# Patient Record
Sex: Female | Born: 1980
Health system: Southern US, Community
[De-identification: ages and names within clinical notes are randomized; demographics above are authoritative.]

## PROBLEM LIST (undated history)

## (undated) DIAGNOSIS — R112 Nausea with vomiting, unspecified: Secondary | ICD-10-CM

## (undated) DIAGNOSIS — Z3492 Encounter for supervision of normal pregnancy, unspecified, second trimester: Secondary | ICD-10-CM

## (undated) DIAGNOSIS — L309 Dermatitis, unspecified: Secondary | ICD-10-CM

## (undated) DIAGNOSIS — J302 Other seasonal allergic rhinitis: Secondary | ICD-10-CM

## (undated) DIAGNOSIS — F3281 Premenstrual dysphoric disorder: Secondary | ICD-10-CM

## (undated) DIAGNOSIS — F329 Major depressive disorder, single episode, unspecified: Secondary | ICD-10-CM

## (undated) DIAGNOSIS — J45909 Unspecified asthma, uncomplicated: Secondary | ICD-10-CM

## (undated) DIAGNOSIS — Z9889 Other specified postprocedural states: Secondary | ICD-10-CM

## (undated) DIAGNOSIS — F32A Depression, unspecified: Secondary | ICD-10-CM

## (undated) DIAGNOSIS — F419 Anxiety disorder, unspecified: Secondary | ICD-10-CM

## (undated) HISTORY — DX: Major depressive disorder, single episode, unspecified: F32.9

## (undated) HISTORY — PX: ABDOMINAL CERCLAGE: SHX5384

## (undated) HISTORY — DX: Other seasonal allergic rhinitis: J30.2

## (undated) HISTORY — DX: Anxiety disorder, unspecified: F41.9

## (undated) HISTORY — DX: Dermatitis, unspecified: L30.9

## (undated) HISTORY — DX: Encounter for supervision of normal pregnancy, unspecified, second trimester: Z34.92

## (undated) HISTORY — PX: WISDOM TOOTH EXTRACTION: SHX21

## (undated) HISTORY — DX: Depression, unspecified: F32.A

---

## 2005-02-17 ENCOUNTER — Other Ambulatory Visit: Admission: RE | Admit: 2005-02-17 | Discharge: 2005-02-17 | Payer: Self-pay | Admitting: Gynecology

## 2006-01-23 ENCOUNTER — Emergency Department (HOSPITAL_COMMUNITY): Admission: EM | Admit: 2006-01-23 | Discharge: 2006-01-23 | Payer: Self-pay | Admitting: *Deleted

## 2006-03-17 ENCOUNTER — Other Ambulatory Visit: Admission: RE | Admit: 2006-03-17 | Discharge: 2006-03-17 | Payer: Self-pay | Admitting: Gynecology

## 2006-11-04 ENCOUNTER — Other Ambulatory Visit: Admission: RE | Admit: 2006-11-04 | Discharge: 2006-11-04 | Payer: Self-pay | Admitting: Gynecology

## 2007-11-08 ENCOUNTER — Other Ambulatory Visit: Admission: RE | Admit: 2007-11-08 | Discharge: 2007-11-08 | Payer: Self-pay | Admitting: Gynecology

## 2008-12-20 ENCOUNTER — Other Ambulatory Visit: Admission: RE | Admit: 2008-12-20 | Discharge: 2008-12-20 | Payer: Self-pay | Admitting: Gynecology

## 2008-12-20 ENCOUNTER — Encounter: Payer: Self-pay | Admitting: Gynecology

## 2008-12-20 ENCOUNTER — Ambulatory Visit: Payer: Self-pay | Admitting: Gynecology

## 2008-12-25 ENCOUNTER — Ambulatory Visit: Payer: Self-pay | Admitting: Gynecology

## 2009-12-31 ENCOUNTER — Other Ambulatory Visit: Admission: RE | Admit: 2009-12-31 | Discharge: 2009-12-31 | Payer: Self-pay | Admitting: Gynecology

## 2009-12-31 ENCOUNTER — Ambulatory Visit: Payer: Self-pay | Admitting: Gynecology

## 2010-01-13 ENCOUNTER — Ambulatory Visit: Payer: Self-pay | Admitting: Gynecology

## 2011-01-06 ENCOUNTER — Encounter (INDEPENDENT_AMBULATORY_CARE_PROVIDER_SITE_OTHER): Payer: Self-pay | Admitting: Gynecology

## 2011-01-06 ENCOUNTER — Other Ambulatory Visit (HOSPITAL_COMMUNITY)
Admission: RE | Admit: 2011-01-06 | Discharge: 2011-01-06 | Disposition: A | Discharge status: Home / Self Care | Payer: Self-pay | Source: Ambulatory Visit | Attending: Gynecology | Admitting: Gynecology

## 2011-01-06 ENCOUNTER — Other Ambulatory Visit: Payer: Self-pay | Admitting: Gynecology

## 2011-01-06 DIAGNOSIS — Z124 Encounter for screening for malignant neoplasm of cervix: Secondary | ICD-10-CM | POA: Insufficient documentation

## 2011-01-06 DIAGNOSIS — Z01419 Encounter for gynecological examination (general) (routine) without abnormal findings: Secondary | ICD-10-CM

## 2011-01-14 ENCOUNTER — Other Ambulatory Visit: Payer: Self-pay

## 2011-01-15 ENCOUNTER — Other Ambulatory Visit: Payer: Self-pay

## 2011-01-15 DIAGNOSIS — E559 Vitamin D deficiency, unspecified: Secondary | ICD-10-CM

## 2011-02-18 ENCOUNTER — Ambulatory Visit (INDEPENDENT_AMBULATORY_CARE_PROVIDER_SITE_OTHER): Payer: Self-pay | Admitting: Gynecology

## 2011-02-18 ENCOUNTER — Other Ambulatory Visit: Payer: Self-pay

## 2011-02-18 DIAGNOSIS — N949 Unspecified condition associated with female genital organs and menstrual cycle: Secondary | ICD-10-CM

## 2011-02-18 DIAGNOSIS — N83209 Unspecified ovarian cyst, unspecified side: Secondary | ICD-10-CM

## 2011-02-18 DIAGNOSIS — R1031 Right lower quadrant pain: Secondary | ICD-10-CM

## 2011-02-18 DIAGNOSIS — D3910 Neoplasm of uncertain behavior of unspecified ovary: Secondary | ICD-10-CM

## 2011-06-23 ENCOUNTER — Encounter: Payer: Self-pay | Admitting: *Deleted

## 2011-06-23 DIAGNOSIS — F419 Anxiety disorder, unspecified: Secondary | ICD-10-CM | POA: Insufficient documentation

## 2011-06-23 DIAGNOSIS — F32A Depression, unspecified: Secondary | ICD-10-CM | POA: Insufficient documentation

## 2011-06-23 DIAGNOSIS — F329 Major depressive disorder, single episode, unspecified: Secondary | ICD-10-CM | POA: Insufficient documentation

## 2011-06-23 DIAGNOSIS — G43909 Migraine, unspecified, not intractable, without status migrainosus: Secondary | ICD-10-CM | POA: Insufficient documentation

## 2011-07-01 ENCOUNTER — Other Ambulatory Visit: Payer: Self-pay

## 2011-07-01 ENCOUNTER — Ambulatory Visit (INDEPENDENT_AMBULATORY_CARE_PROVIDER_SITE_OTHER): Payer: Self-pay | Admitting: Gynecology

## 2011-07-01 DIAGNOSIS — N83209 Unspecified ovarian cyst, unspecified side: Secondary | ICD-10-CM

## 2011-07-01 DIAGNOSIS — N949 Unspecified condition associated with female genital organs and menstrual cycle: Secondary | ICD-10-CM

## 2011-07-01 DIAGNOSIS — D390 Neoplasm of uncertain behavior of uterus: Secondary | ICD-10-CM

## 2011-07-01 DIAGNOSIS — N8300 Follicular cyst of ovary, unspecified side: Secondary | ICD-10-CM

## 2011-07-01 NOTE — Progress Notes (Signed)
And patient is a 30 year old gravida 0 who presented to the office today for followup she was seen in the office on April 20 as a result of a prior visit to the emergency room due to the acute nature of right lower quadrant pain. When she was seen in the office on that day she was found to have a right ovarian cyst which measured 37 x 38 x 29 mm with positive color flow left ovary was otherwise normal it appeared that this was a hemorrhagic cyst and she was given Motrin to take when necessary for her discomfort and was instructed to return back to the office in 3 months for followup ultrasound she did today.  Ultrasound: Uterus measuring 68 x 36 x 34 mm with endometrial stripe of 4.0 mm. Today is day 14 of her cycle. Previous cystic mass of the right ovary not seen only right and left small follicles noted. Patient is asymptomatic. Patient is otherwise scheduled to return back next year for her annual exam. She is to continue her Sprintec oral contraceptive pill.

## 2011-08-06 ENCOUNTER — Encounter: Payer: Self-pay | Admitting: Gynecology

## 2011-08-11 ENCOUNTER — Other Ambulatory Visit: Payer: Self-pay | Admitting: *Deleted

## 2011-08-11 MED ORDER — ALPRAZOLAM 0.5 MG PO TABS: 0.5000 mg | ORAL_TABLET | Freq: Every evening | ORAL | Status: AC | PRN

## 2011-08-11 NOTE — Telephone Encounter (Signed)
Pharmacy faxed over refill request

## 2011-08-11 NOTE — Telephone Encounter (Signed)
rx called in to pharmacy. Dr.G approved rx with 1 additional refill.

## 2012-01-10 ENCOUNTER — Other Ambulatory Visit: Payer: Self-pay | Admitting: *Deleted

## 2012-01-10 MED ORDER — NORGESTIMATE-ETH ESTRADIOL 0.25-35 MG-MCG PO TABS: 1.0000 | ORAL_TABLET | Freq: Every day | ORAL | Status: DC

## 2012-01-13 ENCOUNTER — Other Ambulatory Visit: Payer: Self-pay | Admitting: *Deleted

## 2012-01-13 ENCOUNTER — Encounter: Payer: Self-pay | Admitting: Gynecology

## 2012-01-13 ENCOUNTER — Ambulatory Visit (INDEPENDENT_AMBULATORY_CARE_PROVIDER_SITE_OTHER): Payer: Self-pay | Admitting: Gynecology

## 2012-01-13 VITALS — BP 122/80 | Ht 66.25 in | Wt 205.0 lb

## 2012-01-13 DIAGNOSIS — IMO0001 Reserved for inherently not codable concepts without codable children: Secondary | ICD-10-CM

## 2012-01-13 DIAGNOSIS — Z8041 Family history of malignant neoplasm of ovary: Secondary | ICD-10-CM

## 2012-01-13 DIAGNOSIS — Z01419 Encounter for gynecological examination (general) (routine) without abnormal findings: Secondary | ICD-10-CM

## 2012-01-13 DIAGNOSIS — F411 Generalized anxiety disorder: Secondary | ICD-10-CM

## 2012-01-13 DIAGNOSIS — F419 Anxiety disorder, unspecified: Secondary | ICD-10-CM

## 2012-01-13 DIAGNOSIS — Z8669 Personal history of other diseases of the nervous system and sense organs: Secondary | ICD-10-CM

## 2012-01-13 DIAGNOSIS — Z309 Encounter for contraceptive management, unspecified: Secondary | ICD-10-CM

## 2012-01-13 LAB — CBC WITH DIFFERENTIAL/PLATELET
Basophils Relative: 0 % (ref 0–1)
Eosinophils Absolute: 0.3 10*3/uL (ref 0.0–0.7)
Eosinophils Relative: 4 % (ref 0–5)
Lymphocytes Relative: 27 % (ref 12–46)
Lymphs Abs: 2.2 10*3/uL (ref 0.7–4.0)
MCV: 103.2 fL — ABNORMAL HIGH (ref 78.0–100.0)
Monocytes Absolute: 0.3 10*3/uL (ref 0.1–1.0)
Monocytes Relative: 4 % (ref 3–12)
Neutro Abs: 5.4 10*3/uL (ref 1.7–7.7)

## 2012-01-13 LAB — CHOLESTEROL, TOTAL: Cholesterol: 185 mg/dL (ref 0–200)

## 2012-01-13 MED ORDER — SUMATRIPTAN SUCCINATE 100 MG PO TABS: 100.0000 mg | ORAL_TABLET | ORAL | Status: DC | PRN

## 2012-01-13 MED ORDER — NORGESTIMATE-ETH ESTRADIOL 0.25-35 MG-MCG PO TABS: 1.0000 | ORAL_TABLET | Freq: Every day | ORAL | Status: DC

## 2012-01-13 MED ORDER — SUMATRIPTAN SUCCINATE 50 MG PO TABS: 50.0000 mg | ORAL_TABLET | ORAL | Status: DC | PRN

## 2012-01-13 MED ORDER — ALPRAZOLAM 0.5 MG PO TABS: 0.5000 mg | ORAL_TABLET | ORAL | Status: DC | PRN

## 2012-01-13 NOTE — Patient Instructions (Addendum)
Smoking Cessation This document explains the best ways for you to quit smoking and new treatments to help. It lists new medicines that can double or triple your chances of quitting and quitting for good. It also considers ways to avoid relapses and concerns you may have about quitting, including weight gain. NICOTINE: A POWERFUL ADDICTION If you have tried to quit smoking, you know how hard it can be. It is hard because nicotine is a very addictive drug. For some people, it can be as addictive as heroin or cocaine. Usually, people make 2 or 3 tries, or more, before finally being able to quit. Each time you try to quit, you can learn about what helps and what hurts. Quitting takes hard work and a lot of effort, but you can quit smoking. QUITTING SMOKING IS ONE OF THE MOST IMPORTANT THINGS YOU WILL EVER DO.  You will live longer, feel better, and live better.   The impact on your body of quitting smoking is felt almost immediately:   Within 20 minutes, blood pressure decreases. Pulse returns to its normal level.   After 8 hours, carbon monoxide levels in the blood return to normal. Oxygen level increases.   After 24 hours, chance of heart attack starts to decrease. Breath, hair, and body stop smelling like smoke.   After 48 hours, damaged nerve endings begin to recover. Sense of taste and smell improve.   After 72 hours, the body is virtually free of nicotine. Bronchial tubes relax and breathing becomes easier.   After 2 to 12 weeks, lungs can hold more air. Exercise becomes easier and circulation improves.   Quitting will reduce your risk of having a heart attack, stroke, cancer, or lung disease:   After 1 year, the risk of coronary heart disease is cut in half.   After 5 years, the risk of stroke falls to the same as a nonsmoker.   After 10 years, the risk of lung cancer is cut in half and the risk of other cancers decreases significantly.   After 15 years, the risk of coronary heart  disease drops, usually to the level of a nonsmoker.   If you are pregnant, quitting smoking will improve your chances of having a healthy baby.   The people you live with, especially your children, will be healthier.   You will have extra money to spend on things other than cigarettes.  FIVE KEYS TO QUITTING Studies have shown that these 5 steps will help you quit smoking and quit for good. You have the best chances of quitting if you use them together: 1. Get ready.  2. Get support and encouragement.  3. Learn new skills and behaviors.  4. Get medicine to reduce your nicotine addiction and use it correctly.  5. Be prepared for relapse or difficult situations. Be determined to continue trying to quit, even if you do not succeed at first.  1. GET READY  Set a quit date.   Change your environment.   Get rid of ALL cigarettes, ashtrays, matches, and lighters in your home, car, and place of work.   Do not let people smoke in your home.   Review your past attempts to quit. Think about what worked and what did not.   Once you quit, do not smoke. NOT EVEN A PUFF!  2. GET SUPPORT AND ENCOURAGEMENT Studies have shown that you have a better chance of being successful if you have help. You can get support in many ways.  Tell   your family, friends, and coworkers that you are going to quit and need their support. Ask them not to smoke around you.   Talk to your caregivers (doctor, dentist, nurse, pharmacist, psychologist, and/or smoking counselor).   Get individual, group, or telephone counseling and support. The more counseling you have, the better your chances are of quitting. Programs are available at local hospitals and health centers. Call your local health department for information about programs in your area.   Spiritual beliefs and practices may help some smokers quit.   Quit meters are small computer programs online or downloadable that keep track of quit statistics, such as amount  of "quit-time," cigarettes not smoked, and money saved.   Many smokers find one or more of the many self-help books available useful in helping them quit and stay off tobacco.  3. LEARN NEW SKILLS AND BEHAVIORS  Try to distract yourself from urges to smoke. Talk to someone, go for a walk, or occupy your time with a task.   When you first try to quit, change your routine. Take a different route to work. Drink tea instead of coffee. Eat breakfast in a different place.   Do something to reduce your stress. Take a hot bath, exercise, or read a book.   Plan something enjoyable to do every day. Reward yourself for not smoking.   Explore interactive web-based programs that specialize in helping you quit.  4. GET MEDICINE AND USE IT CORRECTLY Medicines can help you stop smoking and decrease the urge to smoke. Combining medicine with the above behavioral methods and support can quadruple your chances of successfully quitting smoking. The [place] Food and Drug Administration (FDA) has approved 7 medicines to help you quit smoking. These medicines fall into 3 categories.  Nicotine replacement therapy (delivers nicotine to your body without the negative effects and risks of smoking):   Nicotine gum: Available over-the-counter.   Nicotine lozenges: Available over-the-counter.   Nicotine inhaler: Available by prescription.   Nicotine nasal spray: Available by prescription.   Nicotine skin patches (transdermal): Available by prescription and over-the-counter.   Antidepressant medicine (helps people abstain from smoking, but how this works is unknown):   Bupropion sustained-release (SR) tablets: Available by prescription.   Nicotinic receptor partial agonist (simulates the effect of nicotine in your brain):   Varenicline tartrate tablets: Available by prescription.   Ask your caregiver for advice about which medicines to use and how to use them. Carefully read the information on the package.    Everyone who is trying to quit may benefit from using a medicine. If you are pregnant or trying to become pregnant, nursing an infant, you are under age 18, or you smoke fewer than 10 cigarettes per day, talk to your caregiver before taking any nicotine replacement medicines.   You should stop using a nicotine replacement product and call your caregiver if you experience nausea, dizziness, weakness, vomiting, fast or irregular heartbeat, mouth problems with the lozenge or gum, or redness or swelling of the skin around the patch that does not go away.   Do not use any other product containing nicotine while using a nicotine replacement product.   Talk to your caregiver before using these products if you have diabetes, heart disease, asthma, stomach ulcers, you had a recent heart attack, you have high blood pressure that is not controlled with medicine, a history of irregular heartbeat, or you have been prescribed medicine to help you quit smoking.  5. BE PREPARED FOR RELAPSE OR   DIFFICULT SITUATIONS  Most relapses occur within the first 3 months after quitting. Do not be discouraged if you start smoking again. Remember, most people try several times before they finally quit.   You may have symptoms of withdrawal because your body is used to nicotine. You may crave cigarettes, be irritable, feel very hungry, cough often, get headaches, or have difficulty concentrating.   The withdrawal symptoms are only temporary. They are strongest when you first quit, but they will go away within 10 to 14 days.  Here are some difficult situations to watch for:  Alcohol. Avoid drinking alcohol. Drinking lowers your chances of successfully quitting.   Caffeine. Try to reduce the amount of caffeine you consume. It also lowers your chances of successfully quitting.   Other smokers. Being around smoking can make you want to smoke. Avoid smokers.   Weight gain. Many smokers will gain weight when they quit, usually  less than 10 pounds. Eat a healthy diet and stay active. Do not let weight gain distract you from your main goal, quitting smoking. Some medicines that help you quit smoking may also help delay weight gain. You can always lose the weight gained after you quit.   Bad mood or depression. There are a lot of ways to improve your mood other than smoking.  If you are having problems with any of these situations, talk to your caregiver. SPECIAL SITUATIONS AND CONDITIONS Studies suggest that everyone can quit smoking. Your situation or condition can give you a special reason to quit.  Pregnant women/new mothers: By quitting, you protect your baby's health and your own.   Hospitalized patients: By quitting, you reduce health problems and help healing.   Heart attack patients: By quitting, you reduce your risk of a second heart attack.   Lung, head, and neck cancer patients: By quitting, you reduce your chance of a second cancer.   Parents of children and adolescents: By quitting, you protect your children from illnesses caused by secondhand smoke.  QUESTIONS TO THINK ABOUT Think about the following questions before you try to stop smoking. You may want to talk about your answers with your caregiver.  Why do you want to quit?   If you tried to quit in the past, what helped and what did not?   What will be the most difficult situations for you after you quit? How will you plan to handle them?   Who can help you through the tough times? Your family? Friends? Caregiver?   What pleasures do you get from smoking? What ways can you still get pleasure if you quit?  Here are some questions to ask your caregiver:  How can you help me to be successful at quitting?   What medicine do you think would be best for me and how should I take it?   What should I do if I need more help?   What is smoking withdrawal like? How can I get information on withdrawal?  Quitting takes hard work and a lot of effort,  but you can quit smoking. FOR MORE INFORMATION  Smokefree.gov (http://www.davis-sullivan.com/) provides free, accurate, evidence-based information and professional assistance to help support the immediate and long-term needs of people trying to quit smoking. Document Released: 10/19/2001 Document Revised: 10/14/2011 Document Reviewed: 08/11/2009 [facility] Patient Information 2012 [place] [address], [place].  Exercise to Lose Weight Exercise and a healthy diet may help you lose weight. Your doctor may suggest specific exercises. EXERCISE IDEAS AND TIPS  Choose low-cost things you enjoy  doing, such as walking, bicycling, or exercising to workout videos.   Take stairs instead of the elevator.   Walk during your lunch break.   Park your car further away from work or school.   Go to a gym or an exercise class.   Start with 5 to 10 minutes of exercise each day. Build up to 30 minutes of exercise 4 to 6 days a week.   Wear shoes with good support and comfortable clothes.   Stretch before and after working out.   Work out until you breathe harder and your heart beats faster.   Drink extra water when you exercise.   Do not do so much that you hurt yourself, feel dizzy, or get very short of breath.  Exercises that burn about 150 calories:  Running 1  miles in 15 minutes.   Playing volleyball for 45 to 60 minutes.   Washing and waxing a car for 45 to 60 minutes.   Playing touch football for 45 minutes.   Walking 1  miles in 35 minutes.   Pushing a stroller 1  miles in 30 minutes.   Playing basketball for 30 minutes.   Raking leaves for 30 minutes.   Bicycling 5 miles in 30 minutes.   Walking 2 miles in 30 minutes.   Dancing for 30 minutes.   Shoveling snow for 15 minutes.   Swimming laps for 20 minutes.   Walking up stairs for 15 minutes.   Bicycling 4 miles in 15 minutes.   Gardening for 30 to 45 minutes.   Jumping rope for 15 minutes.   Washing windows or floors for 45 to  60 minutes.  Document Released: 11/27/2010 Document Revised: 07/07/2011 Document Reviewed: 11/27/2010 [facility] Patient Information 2012 [place], [place].

## 2012-01-13 NOTE — Progress Notes (Signed)
Sherry Arias 1980-11-19 [id number]   History:    31 y.o.  for annual exam with no complaints. Patient is on Sprintec 32 year old contraceptive pills. Patient having normal menstrual cycles. Patient does her monthly self breast examination. Patient with family history of ovarian cancer (mother and aunt).  Past medical history,surgical history, family history and social history were all reviewed and documented in the EPIC chart.  Gynecologic History Patient's last menstrual period was 01/03/2012. Contraception: OCP (estrogen/progesterone) Last Pap: 2012. Results were: normal Last mammogram: Not indicated. Results were: Not indicated  Obstetric History OB History    Grav Para Term Preterm Abortions TAB SAB Ect Mult Living   0                ROS:  Was performed and pertinent positives and negatives are included in the history.  Exam: chaperone present  BP 122/80  Ht 5' 6.25" (1.683 m)  Wt 205 lb (92.987 kg)  BMI 32.84 kg/m2  LMP 01/03/2012  Body mass index is 32.84 kg/(m^2).  General appearance : Well developed well nourished female. No acute distress HEENT: Neck supple, trachea midline, no carotid bruits, no thyroidmegaly Lungs: Clear to auscultation, no rhonchi or wheezes, or rib retractions  Heart: Regular rate and rhythm, no murmurs or gallops Breast:Examined in sitting and supine position were symmetrical in appearance, no palpable masses or tenderness,  no skin retraction, no nipple inversion, no nipple discharge, no skin discoloration, no axillary or supraclavicular lymphadenopathy Abdomen: no palpable masses or tenderness, no rebound or guarding Extremities: no edema or skin discoloration or tenderness  Pelvic:  Bartholin, Urethra, Skene Glands: Within normal limits             Vagina: No gross lesions or discharge  Cervix: No gross lesions or discharge  Uterus  anteverted, normal size, shape and consistency, non-tender and mobile  Adnexa  Without masses or  tenderness  Anus and perineum  normal   Rectovaginal  normal sphincter tone without palpated masses or tenderness             Hemoccult not done     Assessment/Plan:  31 y.o. female for annual exam overweight. We discussed importance a proper diet and exercise. Patient on Sprintec 31 year old contraceptive pill. Patient requesting refill. We discussed such needs to stop smoking because she is a high risk for DVT and pulmonary embolism. Her husband recently stop smoking and she's going to start working on this. CBC, cholesterol, and urinalysis was done today. New Pap smear guidelines discussed, no Pap smear done today. Next year will do a screening ultrasound and CA 125 because of family history of ovarian cancer. Last ultrasound in the office August of 2012 normal. And tie smoking measures instruction sheet provided today.    Ok Edwards MD, 10:02 AM 01/13/2012

## 2012-01-14 LAB — URINALYSIS W MICROSCOPIC + REFLEX CULTURE
Casts: NONE SEEN
Crystals: NONE SEEN
Ketones, ur: NEGATIVE mg/dL
Leukocytes, UA: NEGATIVE
Nitrite: NEGATIVE
Specific Gravity, Urine: <1.005 — ABNORMAL LOW (ref 1.005–1.030)
Squamous Epithelial / LPF: NONE SEEN
Urobilinogen, UA: 0.2 mg/dL (ref 0.0–1.0)
pH: 7.5 (ref 5.0–8.0)

## 2012-01-14 NOTE — Telephone Encounter (Signed)
rx called in

## 2012-02-02 ENCOUNTER — Telehealth: Payer: Self-pay | Admitting: *Deleted

## 2012-02-02 NOTE — Telephone Encounter (Signed)
Pt informed of recent u/a results.

## 2012-07-18 ENCOUNTER — Other Ambulatory Visit: Payer: Self-pay | Admitting: *Deleted

## 2012-07-18 MED ORDER — ALPRAZOLAM 0.5 MG PO TABS: 0.5000 mg | ORAL_TABLET | ORAL | Status: DC | PRN

## 2013-01-17 ENCOUNTER — Other Ambulatory Visit: Payer: Self-pay | Admitting: Gynecology

## 2013-01-17 ENCOUNTER — Ambulatory Visit (INDEPENDENT_AMBULATORY_CARE_PROVIDER_SITE_OTHER): Payer: Self-pay | Admitting: Gynecology

## 2013-01-17 ENCOUNTER — Encounter: Payer: Self-pay | Admitting: Gynecology

## 2013-01-17 VITALS — BP 126/88 | Ht 65.5 in | Wt 195.0 lb

## 2013-01-17 DIAGNOSIS — R634 Abnormal weight loss: Secondary | ICD-10-CM | POA: Insufficient documentation

## 2013-01-17 DIAGNOSIS — R5383 Other fatigue: Secondary | ICD-10-CM | POA: Insufficient documentation

## 2013-01-17 DIAGNOSIS — L709 Acne, unspecified: Secondary | ICD-10-CM | POA: Insufficient documentation

## 2013-01-17 DIAGNOSIS — L708 Other acne: Secondary | ICD-10-CM

## 2013-01-17 DIAGNOSIS — Z01419 Encounter for gynecological examination (general) (routine) without abnormal findings: Secondary | ICD-10-CM

## 2013-01-17 DIAGNOSIS — G43909 Migraine, unspecified, not intractable, without status migrainosus: Secondary | ICD-10-CM

## 2013-01-17 DIAGNOSIS — R5381 Other malaise: Secondary | ICD-10-CM

## 2013-01-17 DIAGNOSIS — N920 Excessive and frequent menstruation with regular cycle: Secondary | ICD-10-CM | POA: Insufficient documentation

## 2013-01-17 LAB — CBC WITH DIFFERENTIAL/PLATELET
Eosinophils Relative: 2 % (ref 0–5)
MCV: 99.3 fL (ref 78.0–100.0)
Monocytes Absolute: 0.4 10*3/uL (ref 0.1–1.0)
Monocytes Relative: 5 % (ref 3–12)
Neutro Abs: 5.3 10*3/uL (ref 1.7–7.7)
Neutrophils Relative %: 62 % (ref 43–77)
Platelets: 300 10*3/uL (ref 150–400)
RDW: 12.5 % (ref 11.5–15.5)

## 2013-01-17 LAB — COMPREHENSIVE METABOLIC PANEL
ALT: 20 U/L (ref 0–35)
AST: 20 U/L (ref 0–37)
Alkaline Phosphatase: 35 U/L — ABNORMAL LOW (ref 39–117)
BUN: 9 mg/dL (ref 6–23)
CO2: 26 mEq/L (ref 19–32)
Calcium: 8.9 mg/dL (ref 8.4–10.5)
Chloride: 105 mEq/L (ref 96–112)
Creat: 0.8 mg/dL (ref 0.50–1.10)
Glucose, Bld: 83 mg/dL (ref 70–99)
Sodium: 138 mEq/L (ref 135–145)
Total Bilirubin: 0.3 mg/dL (ref 0.3–1.2)

## 2013-01-17 MED ORDER — ALPRAZOLAM 0.5 MG PO TABS: 0.5000 mg | ORAL_TABLET | ORAL | Status: DC | PRN

## 2013-01-17 MED ORDER — LEVONORGESTREL-ETHINYL ESTRAD 0.1-20 MG-MCG PO TABS: 1.0000 | ORAL_TABLET | Freq: Every day | ORAL | Status: DC

## 2013-01-17 MED ORDER — SUMATRIPTAN SUCCINATE 100 MG PO TABS: 100.0000 mg | ORAL_TABLET | ORAL | Status: DC | PRN

## 2013-01-17 NOTE — Patient Instructions (Addendum)
Smoking Cessation Quitting smoking is important to your health and has many advantages. However, it is not always easy to quit since nicotine is a very addictive drug. Often times, people try 3 times or more before being able to quit. This document explains the best ways for you to prepare to quit smoking. Quitting takes hard work and a lot of effort, but you can do it. ADVANTAGES OF QUITTING SMOKING  You will live longer, feel better, and live better.  Your body will feel the impact of quitting smoking almost immediately.  Within 20 minutes, blood pressure decreases. Your pulse returns to its normal level.  After 8 hours, carbon monoxide levels in the blood return to normal. Your oxygen level increases.  After 24 hours, the chance of having a heart attack starts to decrease. Your breath, hair, and body stop smelling like smoke.  After 48 hours, damaged nerve endings begin to recover. Your sense of taste and smell improve.  After 72 hours, the body is virtually free of nicotine. Your bronchial tubes relax and breathing becomes easier.  After 2 to 12 weeks, lungs can hold more air. Exercise becomes easier and circulation improves.  The risk of having a heart attack, stroke, cancer, or lung disease is greatly reduced.  After 1 year, the risk of coronary heart disease is cut in half.  After 5 years, the risk of stroke falls to the same as a nonsmoker.  After 10 years, the risk of lung cancer is cut in half and the risk of other cancers decreases significantly.  After 15 years, the risk of coronary heart disease drops, usually to the level of a nonsmoker.  If you are pregnant, quitting smoking will improve your chances of having a healthy baby.  The people you live with, especially any children, will be healthier.  You will have extra money to spend on things other than cigarettes. QUESTIONS TO THINK ABOUT BEFORE ATTEMPTING TO QUIT You may want to talk about your answers with your  caregiver.  Why do you want to quit?  If you tried to quit in the past, what helped and what did not?  What will be the most difficult situations for you after you quit? How will you plan to handle them?  Who can help you through the tough times? Your family? Friends? A caregiver?  What pleasures do you get from smoking? What ways can you still get pleasure if you quit? Here are some questions to ask your caregiver:  How can you help me to be successful at quitting?  What medicine do you think would be best for me and how should I take it?  What should I do if I need more help?  What is smoking withdrawal like? How can I get information on withdrawal? GET READY  Set a quit date.  Change your environment by getting rid of all cigarettes, ashtrays, matches, and lighters in your home, car, or work. Do not let people smoke in your home.  Review your past attempts to quit. Think about what worked and what did not. GET SUPPORT AND ENCOURAGEMENT You have a better chance of being successful if you have help. You can get support in many ways.  Tell your family, friends, and co-workers that you are going to quit and need their support. Ask them not to smoke around you.  Get individual, group, or telephone counseling and support. Programs are available at local hospitals and health centers. Call your local health department for   information about programs in your area.  Spiritual beliefs and practices may help some smokers quit.  Download a "quit meter" on your computer to keep track of quit statistics, such as how long you have gone without smoking, cigarettes not smoked, and money saved.  Get a self-help book about quitting smoking and staying off of tobacco. LEARN NEW SKILLS AND BEHAVIORS  Distract yourself from urges to smoke. Talk to someone, go for a walk, or occupy your time with a task.  Change your normal routine. Take a different route to work. Drink tea instead of coffee.  Eat breakfast in a different place.  Reduce your stress. Take a hot bath, exercise, or read a book.  Plan something enjoyable to do every day. Reward yourself for not smoking.  Explore interactive web-based programs that specialize in helping you quit. GET MEDICINE AND USE IT CORRECTLY Medicines can help you stop smoking and decrease the urge to smoke. Combining medicine with the above behavioral methods and support can greatly increase your chances of successfully quitting smoking.  Nicotine replacement therapy helps deliver nicotine to your body without the negative effects and risks of smoking. Nicotine replacement therapy includes nicotine gum, lozenges, inhalers, nasal sprays, and skin patches. Some may be available over-the-counter and others require a prescription.  Antidepressant medicine helps people abstain from smoking, but how this works is unknown. This medicine is available by prescription.  Nicotinic receptor partial agonist medicine simulates the effect of nicotine in your brain. This medicine is available by prescription. Ask your caregiver for advice about which medicines to use and how to use them based on your health history. Your caregiver will tell you what side effects to look out for if you choose to be on a medicine or therapy. Carefully read the information on the package. Do not use any other product containing nicotine while using a nicotine replacement product.  RELAPSE OR DIFFICULT SITUATIONS Most relapses occur within the first 3 months after quitting. Do not be discouraged if you start smoking again. Remember, most people try several times before finally quitting. You may have symptoms of withdrawal because your body is used to nicotine. You may crave cigarettes, be irritable, feel very hungry, cough often, get headaches, or have difficulty concentrating. The withdrawal symptoms are only temporary. They are strongest when you first quit, but they will go away within  10 14 days. To reduce the chances of relapse, try to:  Avoid drinking alcohol. Drinking lowers your chances of successfully quitting.  Reduce the amount of caffeine you consume. Once you quit smoking, the amount of caffeine in your body increases and can give you symptoms, such as a rapid heartbeat, sweating, and anxiety.  Avoid smokers because they can make you want to smoke.  Do not let weight gain distract you. Many smokers will gain weight when they quit, usually less than 10 pounds. Eat a healthy diet and stay active. You can always lose the weight gained after you quit.  Find ways to improve your mood other than smoking. FOR MORE INFORMATION  www.smokefree.gov  Document Released: 10/19/2001 Document Revised: 04/25/2012 Document Reviewed: 02/03/2012 [facility] Patient Information 2013 [place], [place].  Varenicline oral tablets What is this medicine? VARENICLINE (var EN i kleen) is used to help people quit smoking. It can reduce the symptoms caused by stopping smoking. It is used with a patient support program recommended by your physician. This medicine may be used for other purposes; ask your health care provider or pharmacist if you  have questions. What should I tell my health care provider before I take this medicine? They need to know if you have any of these conditions: -bipolar disorder, depression, schizophrenia or other mental illness -heart disease -kidney disease -peripheral vascular disease -stroke -suicidal thoughts, plans, or attempt; a previous suicide attempt by you or a family member -an unusual or allergic reaction to varenicline, other medicines, foods, dyes, or preservatives -pregnant or trying to get pregnant -breast-feeding How should I use this medicine? You should set a date to stop smoking and tell your doctor. Start this medicine one week before the quit date. You can also start taking this medicine before you choose a quit date, and then pick a quit date  that is between 8 and 35 days of treatment with this medicine. Stick to your plan; ask about support groups or other ways to help you remain a 'quitter'. Take this medicine by mouth after eating. Take with a full glass of water. Follow the directions on the prescription label. Take your doses at regular intervals. Do not take your medicine more often than directed. A special MedGuide will be given to you by the pharmacist with each prescription and refill. Be sure to read this information carefully each time. Talk to your pediatrician regarding the use of this medicine in children. This medicine is not approved for use in children. Overdosage: If you think you have taken too much of this medicine contact a poison control center or emergency room at once. NOTE: This medicine is only for you. Do not share this medicine with others. What if I miss a dose? If you miss a dose, take it as soon as you can. If it is almost time for your next dose, take only that dose. Do not take double or extra doses. What may interact with this medicine? -insulin -other stop smoking aids -theophylline -warfarin This list may not describe all possible interactions. Give your health care provider a list of all the medicines, herbs, non-prescription drugs, or dietary supplements you use. Also tell them if you smoke, drink alcohol, or use illegal drugs. Some items may interact with your medicine. What should I watch for while using this medicine? Visit your doctor or health care professional for regular check ups. Ask for ongoing advice and encouragement from your doctor or healthcare professional, friends, and family to help you quit. If you smoke while on this medication, quit again Your mouth may get dry. Chewing sugarless gum or sucking hard candy, and drinking plenty of water may help. Contact your doctor if the problem does not go away or is severe. You may get drowsy or dizzy. Do not drive, use machinery, or do  anything that needs mental alertness until you know how this medicine affects you. Do not stand or sit up quickly, especially if you are an older patient. This reduces the risk of dizzy or fainting spells. The use of this medicine may increase the chance of suicidal thoughts or actions. Pay special attention to how you are responding while on this medicine. Any worsening of mood, or thoughts of suicide or dying should be reported to your health care professional right away. What side effects may I notice from receiving this medicine? Side effects that you should report to your doctor or health care professional as soon as possible: -allergic reactions like skin rash, itching or hives, swelling of the face, lips, tongue, or throat -breathing problems -changes in vision -chest pain or chest tightness -confusion, trouble speaking  or understanding -fast, irregular heartbeat -feeling faint or lightheaded, falls -fever -pain in legs when walking -problems with balance, talking, walking -ringing in ears -sudden numbness or weakness of the face, arm or leg -suicidal thoughts or other mood changes -trouble passing urine or change in the amount of urine -unusual bleeding or bruising -unusually weak or tired Side effects that usually do not require medical attention (report to your doctor or health care professional if they continue or are bothersome): -constipation -headache -nausea, vomiting -strange dreams -stomach gas -trouble sleeping This list may not describe all possible side effects. Call your doctor for medical advice about side effects. You may report side effects to FDA at 1-800-FDA-1088. Where should I keep my medicine? Keep out of the reach of children. Store at room temperature between 15 and 30 degrees C (59 and 86 degrees F). Throw away any unused medicine after the expiration date. NOTE: This sheet is a summary. It may not cover all possible information. If you have questions  about this medicine, talk to your doctor, pharmacist, or health care provider.  2013, [employer]. (06/01/2010 3:12:38 PM)

## 2013-01-17 NOTE — Progress Notes (Addendum)
Sherry Arias 15-Apr-1981 [id number]   History:    32 y.o.  for annual gyn exam with several complaints today such as tiredness, loss of weight as well as acne. She is currently on Sprintec 28 day oral contraceptive pills and states her cycles are very heavy as well. She's had mood swings as well as occasional migraines. She takes Imitrex for her migraines. Review of her record indicated she was weighing 205 pounds and is down to 195 although she states she is eating better. Patient with no history of abnormal Pap smears in the past. She does have strong family history of diabetes. She stated that in 2013 she was treated for almost 2 weeks for upper respiratory tract infection. She also has complained of vasomotor symptoms.  Past medical history,surgical history, family history and social history were all reviewed and documented in the EPIC chart.  Gynecologic History Patient's last menstrual period was 12/31/2012. Contraception: OCP (estrogen/progesterone) Last Pap: 2012. Results were: normal Last mammogram: not indicated. Results were: not indicated Obstetric History OB History   Grav Para Term Preterm Abortions TAB SAB Ect Mult Living   0                ROS: A ROS was performed and pertinent positives and negatives are included in the history.  GENERAL: No fevers or chills. HEENT: No change in vision, no earache, sore throat or sinus congestion. NECK: No pain or stiffness. CARDIOVASCULAR: No chest pain or pressure. No palpitations. PULMONARY: No shortness of breath, cough or wheeze. GASTROINTESTINAL: No abdominal pain, nausea, vomiting or diarrhea, melena or bright red blood per rectum. GENITOURINARY: No urinary frequency, urgency, hesitancy or dysuria. MUSCULOSKELETAL: No joint or muscle pain, no back pain, no recent trauma. DERMATOLOGIC: No rash, no itching, no lesions. ENDOCRINE: No polyuria, polydipsia, no heat or cold intolerance. No recent change in weight. HEMATOLOGICAL: No anemia or  easy bruising or bleeding. NEUROLOGIC: No headache, seizures, numbness, tingling or weakness. PSYCHIATRIC: No depression, no loss of interest in normal activity or change in sleep pattern.    Tattoos and rings throughout different parts of her body  Exam: chaperone present  BP 126/88  Ht 5' 5.5" (1.664 m)  Wt 195 lb (88.451 kg)  BMI 31.94 kg/m2  LMP 12/31/2012  Body mass index is 31.94 kg/(m^2).  General appearance : Well developed well nourished female. No acute distress HEENT: Neck supple, trachea midline, no carotid bruits, no thyroidmegaly Lungs: Clear to auscultation, no rhonchi or wheezes, or rib retractions  Heart: Regular rate and rhythm, no murmurs or gallops Breast:Examined in sitting and supine position were symmetrical in appearance, no palpable masses or tenderness,  no skin retraction, no nipple inversion, no nipple discharge, no skin discoloration, no axillary or supraclavicular lymphadenopathy Abdomen: no palpable masses or tenderness, no rebound or guarding Extremities: no edema or skin discoloration or tenderness  Pelvic:  Bartholin, Urethra, Skene Glands: Within normal limits             Vagina: No gross lesions or discharge  Cervix: No gross lesions or discharge  Uterus  anteverted, normal size, shape and consistency, non-tender and mobile  Adnexa  Without masses or tenderness  Anus and perineum  normal   Rectovaginal  normal sphincter tone without palpated masses or tenderness             Hemoccult not indicated     Assessment/Plan:  32 y.o. female for annual exam Patient with the above mentioned symptoms we'll order the following  test: Hepatitis panel, hemoglobin A1c, comprehensive metabolic panel, CBC, screening cholesterol, TSH, vitamin B12 and urinalysis. No Pap smear done today the new screening guidelines discussed. She will be switched to oral contraceptive pill with less progesterone and and a genetic activity. She will be started on Aviane 28 day oral  contraceptive pill. Patient was counseled of the possibility of being at risk for deep venous thrombosis and pulmonary embolism especially if she continues to smoke. Patient states that she's in the process of cutting this smoking habit. She was reminded to do her monthly self breast examination. If all the above labs are normal which she continues with the above symptoms I have recommended she followup with internal medicine specialists.    Ok Edwards MD, 2:18 PM 01/17/2013

## 2013-01-18 LAB — URINALYSIS W MICROSCOPIC + REFLEX CULTURE
Crystals: NONE SEEN
Glucose, UA: NEGATIVE mg/dL
Hgb urine dipstick: NEGATIVE
Nitrite: NEGATIVE
Protein, ur: NEGATIVE mg/dL
Urobilinogen, UA: 0.2 mg/dL (ref 0.0–1.0)
pH: 7.5 (ref 5.0–8.0)

## 2013-01-18 LAB — HEPATITIS PANEL, ACUTE
HCV Ab: NEGATIVE
Hep A IgM: NEGATIVE
Hepatitis B Surface Ag: NEGATIVE

## 2013-01-18 LAB — TSH: TSH: 1.227 u[IU]/mL (ref 0.350–4.500)

## 2013-01-24 ENCOUNTER — Telehealth: Payer: Self-pay | Admitting: *Deleted

## 2013-01-24 NOTE — Telephone Encounter (Signed)
Pt informed of recent lab results on 01/17/13.

## 2013-10-17 ENCOUNTER — Other Ambulatory Visit: Payer: Self-pay | Admitting: Obstetrics & Gynecology

## 2013-10-17 ENCOUNTER — Encounter: Payer: Self-pay | Admitting: Obstetrics and Gynecology

## 2013-10-17 ENCOUNTER — Ambulatory Visit (INDEPENDENT_AMBULATORY_CARE_PROVIDER_SITE_OTHER): Payer: Self-pay

## 2013-10-17 ENCOUNTER — Encounter: Payer: Self-pay | Admitting: Obstetrics & Gynecology

## 2013-10-17 ENCOUNTER — Ambulatory Visit (INDEPENDENT_AMBULATORY_CARE_PROVIDER_SITE_OTHER): Payer: Self-pay | Admitting: Obstetrics and Gynecology

## 2013-10-17 ENCOUNTER — Encounter (INDEPENDENT_AMBULATORY_CARE_PROVIDER_SITE_OTHER): Payer: Self-pay

## 2013-10-17 VITALS — BP 132/66 | Ht 66.0 in | Wt 210.4 lb

## 2013-10-17 DIAGNOSIS — O3680X0 Pregnancy with inconclusive fetal viability, not applicable or unspecified: Secondary | ICD-10-CM

## 2013-10-17 DIAGNOSIS — O26849 Uterine size-date discrepancy, unspecified trimester: Secondary | ICD-10-CM

## 2013-10-17 DIAGNOSIS — Z3201 Encounter for pregnancy test, result positive: Secondary | ICD-10-CM

## 2013-10-17 LAB — POCT URINE PREGNANCY: Preg Test, Ur: POSITIVE

## 2013-10-17 NOTE — Progress Notes (Signed)
U/S-transabdominal u/s performed, single IUP with +FCa noted, FHR-172 bpm, CRL c/w 11+0wks EDD 05/08/2014, cx long and closed, bilateral adnexa WNL, posterior Gr 0 placenta

## 2013-10-17 NOTE — Progress Notes (Signed)
Patient ID: Sherry Arias, female   DOB: Apr 04, 1981, 32 y.o.   MRN: [id number] Pt here today for a urine pregnancy test, resulted positive.  Pt informed to return to office in 1 - 2 weeks.

## 2013-10-24 ENCOUNTER — Encounter: Payer: Self-pay | Admitting: Obstetrics and Gynecology

## 2013-10-24 ENCOUNTER — Ambulatory Visit (INDEPENDENT_AMBULATORY_CARE_PROVIDER_SITE_OTHER): Payer: Self-pay | Admitting: Obstetrics and Gynecology

## 2013-10-24 VITALS — BP 120/76 | Ht 66.0 in | Wt 207.0 lb

## 2013-10-24 DIAGNOSIS — Z331 Pregnant state, incidental: Secondary | ICD-10-CM

## 2013-10-24 DIAGNOSIS — O9989 Other specified diseases and conditions complicating pregnancy, childbirth and the puerperium: Secondary | ICD-10-CM

## 2013-10-24 DIAGNOSIS — Z1389 Encounter for screening for other disorder: Secondary | ICD-10-CM

## 2013-10-24 LAB — POCT URINALYSIS DIPSTICK
Blood, UA: NEGATIVE
Ketones, UA: NEGATIVE
Protein, UA: NEGATIVE
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
## 2013-11-08 NOTE — L&D Delivery Note (Signed)
Delivery Note  At 3:08 pm, a previable female infant at [redacted]w[redacted]d was delivered in complete breech presentation. Cord was clamped and cut, he was handed to his mother (patient), father and mother's friend who are all grieving appropriately.  Delivery occurred spontaneously when patient was trying to sit up for an epidural placement, placement never occurred. Apgar scores were 2/2/2 (one for HR, one for respiratory effort) at 11/12/08 minutes.  Misoprostol 600 mcg was placed per rectum, and the placenta delivered spopntaneously at 3:37pm and was noted to be intact on examination.  EBL was 200 ml.  No complications during delivery.  Infant lived for 1 hour and 22 minutes, he was held by his parents for that entire period.  Appropriate support was given to the patient and her family members. Chaplain services were called and helped with giving support.  Patient was discharged to home after three hours of observation, she was stable and had minimal bleeding.  Verita Schneiders, MD, [facility] Attending [place], [place]

## 2013-11-09 ENCOUNTER — Encounter: Payer: Self-pay | Admitting: Adult Health

## 2013-11-09 ENCOUNTER — Ambulatory Visit (INDEPENDENT_AMBULATORY_CARE_PROVIDER_SITE_OTHER): Payer: BC Managed Care – PPO | Admitting: Adult Health

## 2013-11-09 ENCOUNTER — Other Ambulatory Visit: Payer: Self-pay | Admitting: Adult Health

## 2013-11-09 VITALS — BP 118/70 | Wt 218.0 lb

## 2013-11-09 DIAGNOSIS — Z1389 Encounter for screening for other disorder: Secondary | ICD-10-CM

## 2013-11-09 DIAGNOSIS — Z3492 Encounter for supervision of normal pregnancy, unspecified, second trimester: Secondary | ICD-10-CM

## 2013-11-09 DIAGNOSIS — Z331 Pregnant state, incidental: Secondary | ICD-10-CM

## 2013-11-09 DIAGNOSIS — O210 Mild hyperemesis gravidarum: Secondary | ICD-10-CM

## 2013-11-09 DIAGNOSIS — Z3400 Encounter for supervision of normal first pregnancy, unspecified trimester: Secondary | ICD-10-CM

## 2013-11-09 HISTORY — DX: Encounter for supervision of normal pregnancy, unspecified, second trimester: Z34.92

## 2013-11-09 LAB — HEPATITIS B SURFACE ANTIGEN: HEP B S AG: NEGATIVE

## 2013-11-09 LAB — POCT URINALYSIS DIPSTICK
Blood, UA: NEGATIVE
Glucose, UA: NEGATIVE
Ketones, UA: NEGATIVE
LEUKOCYTES UA: NEGATIVE
NITRITE UA: NEGATIVE
PROTEIN UA: NEGATIVE

## 2013-11-09 LAB — CBC
HEMATOCRIT: 38.9 % (ref 36.0–46.0)
Hemoglobin: 13.5 g/dL (ref 12.0–15.0)
MCH: 35.1 pg — ABNORMAL HIGH (ref 26.0–34.0)
MCHC: 34.7 g/dL (ref 30.0–36.0)
MCV: 101 fL — ABNORMAL HIGH (ref 78.0–100.0)
PLATELETS: 303 10*3/uL (ref 150–400)
RBC: 3.85 MIL/uL — ABNORMAL LOW (ref 3.87–5.11)
RDW: 13.3 % (ref 11.5–15.5)
WBC: 10 10*3/uL (ref 4.0–10.5)

## 2013-11-09 LAB — RPR

## 2013-11-09 LAB — HIV ANTIBODY (ROUTINE TESTING W REFLEX): HIV: NONREACTIVE

## 2013-11-09 LAB — TSH: TSH: 1.43 u[IU]/mL (ref 0.350–4.500)

## 2013-11-09 NOTE — Progress Notes (Signed)
  Subjective:    PAYTYN MESTA is a 33 y.o. G16P0 Caucasian female at [redacted]w[redacted]d by [place] being seen today for her first obstetrical visit.  Her obstetrical history is significant for allergies and migraine and depression.  Pregnancy history fully reviewed.   Patient reports nausea, headaches. Denies vb, cramping, uti s/s, abnormal/malodorous vag d/c, or vulvovaginal itching/irritation.  Filed Vitals:   11/09/13 0905  BP: 118/70  Weight: 218 lb (98.884 kg)    HISTORY: OB History  Gravida Para Term Preterm AB SAB TAB Ectopic Multiple Living  1             # Outcome Date GA Lbr Len/2nd Weight Sex Delivery Anes PTL Lv  1 CUR              Past Medical History  Diagnosis Date  . Anxiety   . Depression   . Migraine   . Eczema   . Seasonal allergies   . Supervision of normal pregnancy in second trimester 11/09/2013   Past Surgical History  Procedure Laterality Date  . Wisdom tooth extraction     Family History  Problem Relation Age of Onset  . Ovarian cancer Mother   . Cervical cancer Mother   . Mental illness Mother   . Heart disease Father   . Hypertension Father   . Diabetes Father   . Heart disease Paternal Grandfather      Exam    Pelvic Exam:    Perineum: deferred   Vulva: deferred   Vagina:  deferred   Uterus Normal size/shape/contour for GA     Cervix: normal   Adnexa: Not palpable   Urinary: deferred    System:     Skin: normal coloration and turgor, no rashes Has tattoos and piercing's    Neurologic: oriented, normal mood   Extremities: normal strength, tone, and muscle mass   HEENT PERRLA, thyroid normal size   Mouth/Teeth mucous membranes moist   Cardiovascular: regular rate and rhythm   Respiratory:  appears well, vitals normal, no respiratory distress, acyanotic, normal RR   Abdomen: soft, non-tender   Request pap from Dr Toney Rakes FHR: 161   Assessment:    Pregnancy: G1P0 Patient Active Problem List   Diagnosis Date Noted  . Supervision  of normal pregnancy in second trimester 11/09/2013  . Tiredness 01/17/2013  . Loss of weight 01/17/2013  . Acne 01/17/2013  . Menorrhagia 01/17/2013  . Anxiety   . Depression   . Migraine       [redacted]w[redacted]d G1P0 New OB visit    Plan:     Initial labs drawn Continue prenatal vitamins Problem list reviewed and updated Reviewed n/v relief measures and warning s/s to report Reviewed recommended weight gain based on pre-gravid BMI Encouraged well-balanced diet Genetic Screening discussed Quad Screen: requested Cystic fibrosis screening discussed declined Ultrasound discussed; fetal survey: requested Follow up in 4 weeks for OB visit and see KIM, discuss flu shot again has been sick so did not give today Will try diclegis for nausea  GRIFFIN,Kyomi 11/09/2013 9:37 AM

## 2013-11-09 NOTE — Patient Instructions (Signed)
Second Trimester of Pregnancy The second trimester is from week 13 through week 28, months 4 through 6. The second trimester is often a time when you feel your best. Your body has also adjusted to being pregnant, and you begin to feel better physically. Usually, morning sickness has lessened or quit completely, you may have more energy, and you may have an increase in appetite. The second trimester is also a time when the fetus is growing rapidly. At the end of the sixth month, the fetus is about 9 inches long and weighs about 1 pounds. You will likely begin to feel the baby move (quickening) between 18 and 20 weeks of the pregnancy. BODY CHANGES Your body goes through many changes during pregnancy. The changes vary from woman to woman.   Your weight will continue to increase. You will notice your lower abdomen bulging out.  You may begin to get stretch marks on your hips, abdomen, and breasts.  You may develop headaches that can be relieved by medicines approved by your caregiver.  You may urinate more often because the fetus is pressing on your bladder.  You may develop or continue to have heartburn as a result of your pregnancy.  You may develop constipation because certain hormones are causing the muscles that push waste through your intestines to slow down.  You may develop hemorrhoids or swollen, bulging veins (varicose veins).  You may have back pain because of the weight gain and pregnancy hormones relaxing your joints between the bones in your pelvis and as a result of a shift in weight and the muscles that support your balance.  Your breasts will continue to grow and be tender.  Your gums may bleed and may be sensitive to brushing and flossing.  Dark spots or blotches (chloasma, mask of pregnancy) may develop on your face. This will likely fade after the baby is born.  A dark line from your belly button to the pubic area (linea nigra) may appear. This will likely fade after the  baby is born. WHAT TO EXPECT AT YOUR PRENATAL VISITS During a routine prenatal visit:  You will be weighed to make sure you and the fetus are growing normally.  Your blood pressure will be taken.  Your abdomen will be measured to track your baby's growth.  The fetal heartbeat will be listened to.  Any test results from the previous visit will be discussed. Your caregiver may ask you:  How you are feeling.  If you are feeling the baby move.  If you have had any abnormal symptoms, such as leaking fluid, bleeding, severe headaches, or abdominal cramping.  If you have any questions. Other tests that may be performed during your second trimester include:  Blood tests that check for:  Low iron levels (anemia).  Gestational diabetes (between 24 and 28 weeks).  Rh antibodies.  Urine tests to check for infections, diabetes, or protein in the urine.  An ultrasound to confirm the proper growth and development of the baby.  An amniocentesis to check for possible genetic problems.  Fetal screens for spina bifida and Down syndrome. HOME CARE INSTRUCTIONS   Avoid all smoking, herbs, alcohol, and unprescribed drugs. These chemicals affect the formation and growth of the baby.  Follow your caregiver's instructions regarding medicine use. There are medicines that are either safe or unsafe to take during pregnancy.  Exercise only as directed by your caregiver. Experiencing uterine cramps is a good sign to stop exercising.  Continue to eat regular,   healthy meals.  Wear a good support bra for breast tenderness.  Do not use hot tubs, steam rooms, or saunas.  Wear your seat belt at all times when driving.  Avoid raw meat, uncooked cheese, cat litter boxes, and soil used by cats. These carry germs that can cause birth defects in the baby.  Take your prenatal vitamins.  Try taking a stool softener (if your caregiver approves) if you develop constipation. Eat more high-fiber foods,  such as fresh vegetables or fruit and whole grains. Drink plenty of fluids to keep your urine clear or pale yellow.  Take warm sitz baths to soothe any pain or discomfort caused by hemorrhoids. Use hemorrhoid cream if your caregiver approves.  If you develop varicose veins, wear support hose. Elevate your feet for 15 minutes, 3 4 times a day. Limit salt in your diet.  Avoid heavy lifting, wear low heel shoes, and practice good posture.  Rest with your legs elevated if you have leg cramps or low back pain.  Visit your dentist if you have not gone yet during your pregnancy. Use a soft toothbrush to brush your teeth and be gentle when you floss.  A sexual relationship may be continued unless your caregiver directs you otherwise.  Continue to go to all your prenatal visits as directed by your caregiver. SEEK MEDICAL CARE IF:   You have dizziness.  You have mild pelvic cramps, pelvic pressure, or nagging pain in the abdominal area.  You have persistent nausea, vomiting, or diarrhea.  You have a bad smelling vaginal discharge.  You have pain with urination. SEEK IMMEDIATE MEDICAL CARE IF:   You have a fever.  You are leaking fluid from your vagina.  You have spotting or bleeding from your vagina.  You have severe abdominal cramping or pain.  You have rapid weight gain or loss.  You have shortness of breath with chest pain.  You notice sudden or extreme swelling of your face, hands, ankles, feet, or legs.  You have not felt your baby move in over an hour.  You have severe headaches that do not go away with medicine.  You have vision changes. Document Released: 10/19/2001 Document Revised: 06/27/2013 Document Reviewed: 12/26/2012 [facility] Patient Information 2014 [place]. Follow up in 4 weeks for OB visit with KIM

## 2013-11-10 LAB — VARICELLA ZOSTER ANTIBODY, IGG: Varicella IgG: 782.5 Index — ABNORMAL HIGH (ref <135.00)

## 2013-11-10 LAB — URINALYSIS, ROUTINE W REFLEX MICROSCOPIC
BILIRUBIN URINE: NEGATIVE
GLUCOSE, UA: NEGATIVE mg/dL
HGB URINE DIPSTICK: NEGATIVE
Ketones, ur: NEGATIVE mg/dL
LEUKOCYTES UA: NEGATIVE
Nitrite: NEGATIVE
Protein, ur: NEGATIVE mg/dL
Specific Gravity, Urine: 1.018 (ref 1.005–1.030)
Urobilinogen, UA: 0.2 mg/dL (ref 0.0–1.0)
pH: 8 (ref 5.0–8.0)

## 2013-11-10 LAB — OXYCODONE SCREEN, UA, RFLX CONFIRM: Oxycodone Screen, Ur: NEGATIVE ng/mL

## 2013-11-10 LAB — DRUG SCREEN, URINE, NO CONFIRMATION
AMPHETAMINE SCRN UR: NEGATIVE
BARBITURATE QUANT UR: NEGATIVE
Benzodiazepines.: NEGATIVE
COCAINE METABOLITES: NEGATIVE
Creatinine,U: 72.6 mg/dL
MARIJUANA METABOLITE: NEGATIVE
Methadone: NEGATIVE
Opiate Screen, Urine: NEGATIVE
Phencyclidine (PCP): NEGATIVE
Propoxyphene: NEGATIVE

## 2013-11-10 LAB — ABO AND RH: Rh Type: POSITIVE

## 2013-11-10 LAB — RUBELLA SCREEN: RUBELLA: 1 {index} — AB (ref <0.90)

## 2013-11-10 LAB — ANTIBODY SCREEN: Antibody Screen: NEGATIVE

## 2013-11-11 LAB — URINE CULTURE
Colony Count: NO GROWTH
Organism ID, Bacteria: NO GROWTH

## 2013-11-11 LAB — GC/CHLAMYDIA PROBE AMP
CT PROBE, AMP APTIMA: NEGATIVE
GC PROBE AMP APTIMA: NEGATIVE

## 2013-11-12 LAB — AFP, QUAD SCREEN
AFP: 17.6 IU/mL
Age Alone: 1:428 {titer}
Curr Gest Age: 14.2 wks.days
Down Syndrome Scr Risk Est: 1:784 {titer}
HCG, Total: 29622 m[IU]/mL
INH: 197.5 pg/mL
INTERPRETATION-AFP: NEGATIVE
MOM FOR AFP: 0.94
MOM FOR HCG: 1.02
MoM for INH: 1.32
OPEN SPINA BIFIDA: NEGATIVE
TRI 18 SCR RISK EST: NEGATIVE
Trisomy 18 (Edward) Syndrome Interp.: 1:530 {titer}
UE3 MOM: 0.43
UE3 VALUE: 0.1 ng/mL

## 2013-11-15 ENCOUNTER — Telehealth: Payer: Self-pay | Admitting: Obstetrics and Gynecology

## 2013-11-15 MED ORDER — AZITHROMYCIN 250 MG PO TABS: ORAL_TABLET | ORAL | Status: DC

## 2013-11-15 NOTE — Telephone Encounter (Signed)
Pt c/o red and sore throat, runny nose, no fever, body aches, no improvement with gargling with salt water, lemon tea etc.  Pt states concerned she might have strep throat.

## 2013-11-15 NOTE — Telephone Encounter (Signed)
Has red sore throat and is hoarse, no fever or body aches ,will rx z pack ,use warm salt water gargles and hot fluids, call if fever or body aches

## 2013-12-03 ENCOUNTER — Other Ambulatory Visit (HOSPITAL_COMMUNITY)
Admission: RE | Admit: 2013-12-03 | Discharge: 2013-12-03 | Disposition: A | Discharge status: Home / Self Care | Payer: BC Managed Care – PPO | Source: Ambulatory Visit | Attending: Obstetrics & Gynecology | Admitting: Obstetrics & Gynecology

## 2013-12-03 ENCOUNTER — Ambulatory Visit (INDEPENDENT_AMBULATORY_CARE_PROVIDER_SITE_OTHER): Payer: BC Managed Care – PPO | Admitting: Women's Health

## 2013-12-03 ENCOUNTER — Encounter: Payer: Self-pay | Admitting: Women's Health

## 2013-12-03 VITALS — BP 100/70 | Wt 232.5 lb

## 2013-12-03 DIAGNOSIS — N841 Polyp of cervix uteri: Secondary | ICD-10-CM

## 2013-12-03 DIAGNOSIS — Z1389 Encounter for screening for other disorder: Secondary | ICD-10-CM

## 2013-12-03 DIAGNOSIS — Z23 Encounter for immunization: Secondary | ICD-10-CM

## 2013-12-03 DIAGNOSIS — Z331 Pregnant state, incidental: Secondary | ICD-10-CM

## 2013-12-03 DIAGNOSIS — O210 Mild hyperemesis gravidarum: Secondary | ICD-10-CM

## 2013-12-03 DIAGNOSIS — Z01419 Encounter for gynecological examination (general) (routine) without abnormal findings: Secondary | ICD-10-CM | POA: Insufficient documentation

## 2013-12-03 DIAGNOSIS — Z1151 Encounter for screening for human papillomavirus (HPV): Secondary | ICD-10-CM | POA: Insufficient documentation

## 2013-12-03 DIAGNOSIS — O219 Vomiting of pregnancy, unspecified: Secondary | ICD-10-CM

## 2013-12-03 DIAGNOSIS — Z3400 Encounter for supervision of normal first pregnancy, unspecified trimester: Secondary | ICD-10-CM

## 2013-12-03 LAB — POCT URINALYSIS DIPSTICK
Glucose, UA: NEGATIVE
KETONES UA: NEGATIVE
Leukocytes, UA: NEGATIVE
Nitrite, UA: NEGATIVE
PROTEIN UA: NEGATIVE

## 2013-12-03 MED ORDER — INFLUENZA VAC SPLIT QUAD 0.5 ML IM SUSP
0.5000 mL | Freq: Once | INTRAMUSCULAR | Status: AC
  Administered 2013-12-03: 0.5 mL via INTRAMUSCULAR

## 2013-12-03 MED ORDER — DOXYLAMINE-PYRIDOXINE 10-10 MG PO TBEC: 10.0000 mg | DELAYED_RELEASE_TABLET | ORAL | Status: DC

## 2013-12-03 NOTE — Progress Notes (Signed)
Reports good fm. Denies uc's, lof, vb, uti s/s.  LBP, discussed relief measures.  Pap today. 2 cervical polyps at 1 & 2 o'clock. Requests diclegis rx, is out of samples, rx sent & coupon card given. Reviewed ptl s/s, fm.  All questions answered. F/U in 3wks for anatomy u/s and visit.

## 2013-12-03 NOTE — Patient Instructions (Signed)
[facility]:  [place] [phone]                 [facility] [phone] (usually doesn't accept new patients unless you have family there already, you are always welcome to call and ask)             [facility] ([place]) [phone]   [facility] Pediatricians:   [place]: [phone]  [facility]: [phone]  Second Trimester of Pregnancy The second trimester is from week 13 through week 28, months 4 through 6. The second trimester is often a time when you feel your best. Your body has also adjusted to being pregnant, and you begin to feel better physically. Usually, morning sickness has lessened or quit completely, you may have more energy, and you may have an increase in appetite. The second trimester is also a time when the fetus is growing rapidly. At the end of the sixth month, the fetus is about 9 inches long and weighs about 1 pounds. You will likely begin to feel the baby move (quickening) between 18 and 20 weeks of the pregnancy. BODY CHANGES Your body goes through many changes during pregnancy. The changes vary from woman to woman.   Your weight will continue to increase. You will notice your lower abdomen bulging out.  You may begin to get stretch marks on your hips, abdomen, and breasts.  You may develop headaches that can be relieved by medicines approved by your caregiver.  You may urinate more often because the fetus is pressing on your bladder.  You may develop or continue to have heartburn as a result of your pregnancy.  You may develop constipation because certain hormones are causing the muscles that push waste through your intestines to slow down.  You may develop hemorrhoids or swollen, bulging veins (varicose veins).  You may have back pain because of the weight gain and pregnancy  hormones relaxing your joints between the bones in your pelvis and as a result of a shift in weight and the muscles that support your balance.  Your breasts will continue to grow and be tender.  Your gums may bleed and may be sensitive to brushing and flossing.  Dark spots or blotches (chloasma, mask of pregnancy) may develop on your face. This will likely fade after the baby is born.  A dark line from your belly button to the pubic area (linea nigra) may appear. This will likely fade after the baby is born. WHAT TO EXPECT AT YOUR PRENATAL VISITS During a routine prenatal visit:  You will be weighed to make sure you and the fetus are growing normally.  Your blood pressure will be taken.  Your abdomen will be measured to track your baby's growth.  The fetal heartbeat will be listened to.  Any test results from the previous visit will be discussed. Your caregiver may ask you:  How you are feeling.  If you are feeling the baby move.  If you have had any abnormal symptoms, such as leaking fluid, bleeding, severe headaches, or abdominal cramping.  If you have any questions. Other tests that may be performed during your second trimester include:  Blood tests that check for:  Low iron levels (anemia).  Gestational diabetes (between 24 and 28 weeks).  Rh antibodies.  Urine tests to check for infections, diabetes, or protein in  the urine.  An ultrasound to confirm the proper growth and development of the baby.  An amniocentesis to check for possible genetic problems.  Fetal screens for spina bifida and Down syndrome. HOME CARE INSTRUCTIONS   Avoid all smoking, herbs, alcohol, and unprescribed drugs. These chemicals affect the formation and growth of the baby.  Follow your caregiver's instructions regarding medicine use. There are medicines that are either safe or unsafe to take during pregnancy.  Exercise only as directed by your caregiver. Experiencing uterine cramps is a  good sign to stop exercising.  Continue to eat regular, healthy meals.  Wear a good support bra for breast tenderness.  Do not use hot tubs, steam rooms, or saunas.  Wear your seat belt at all times when driving.  Avoid raw meat, uncooked cheese, cat litter boxes, and soil used by cats. These carry germs that can cause birth defects in the baby.  Take your prenatal vitamins.  Try taking a stool softener (if your caregiver approves) if you develop constipation. Eat more high-fiber foods, such as fresh vegetables or fruit and whole grains. Drink plenty of fluids to keep your urine clear or pale yellow.  Take warm sitz baths to soothe any pain or discomfort caused by hemorrhoids. Use hemorrhoid cream if your caregiver approves.  If you develop varicose veins, wear support hose. Elevate your feet for 15 minutes, 3 4 times a day. Limit salt in your diet.  Avoid heavy lifting, wear low heel shoes, and practice good posture.  Rest with your legs elevated if you have leg cramps or low back pain.  Visit your dentist if you have not gone yet during your pregnancy. Use a soft toothbrush to brush your teeth and be gentle when you floss.  A sexual relationship may be continued unless your caregiver directs you otherwise.  Continue to go to all your prenatal visits as directed by your caregiver. SEEK MEDICAL CARE IF:   You have dizziness.  You have mild pelvic cramps, pelvic pressure, or nagging pain in the abdominal area.  You have persistent nausea, vomiting, or diarrhea.  You have a bad smelling vaginal discharge.  You have pain with urination. SEEK IMMEDIATE MEDICAL CARE IF:   You have a fever.  You are leaking fluid from your vagina.  You have spotting or bleeding from your vagina.  You have severe abdominal cramping or pain.  You have rapid weight gain or loss.  You have shortness of breath with chest pain.  You notice sudden or extreme swelling of your face, hands,  ankles, feet, or legs.  You have not felt your baby move in over an hour.  You have severe headaches that do not go away with medicine.  You have vision changes. Document Released: 10/19/2001 Document Revised: 06/27/2013 Document Reviewed: 12/26/2012 [facility] Patient Information 2014 [place].  Migraine Headache A migraine headache is an intense, throbbing pain on one or both sides of your head. A migraine can last for 30 minutes to several hours. CAUSES  The exact cause of a migraine headache is not always known. However, a migraine may be caused when nerves in the brain become irritated and release chemicals that cause inflammation. This causes pain. Certain things may also trigger migraines, such as:  Alcohol.  Smoking.  Stress.  Menstruation.  Aged cheeses.  Foods or drinks that contain nitrates, glutamate, aspartame, or tyramine.  Lack of sleep.  Chocolate.  Caffeine.  Hunger.  Physical exertion.  Fatigue.  Medicines used to  treat chest pain (nitroglycerine), birth control pills, estrogen, and some blood pressure medicines. SIGNS AND SYMPTOMS  Pain on one or both sides of your head.  Pulsating or throbbing pain.  Severe pain that prevents daily activities.  Pain that is aggravated by any physical activity.  Nausea, vomiting, or both.  Dizziness.  Pain with exposure to bright lights, loud noises, or activity.  General sensitivity to bright lights, loud noises, or smells. Before you get a migraine, you may get warning signs that a migraine is coming (aura). An aura may include:  Seeing flashing lights.  Seeing bright spots, halos, or zig-zag lines.  Having tunnel vision or blurred vision.  Having feelings of numbness or tingling.  Having trouble talking.  Having muscle weakness. DIAGNOSIS  A migraine headache is often diagnosed based on:  Symptoms.  Physical exam.  A CT scan or MRI of your head. These imaging tests cannot  diagnose migraines, but they can help rule out other causes of headaches. TREATMENT Medicines may be given for pain and nausea. Medicines can also be given to help prevent recurrent migraines.  HOME CARE INSTRUCTIONS  Only take over-the-counter or prescription medicines for pain or discomfort as directed by your health care provider. The use of long-term narcotics is not recommended.  Lie down in a dark, quiet room when you have a migraine.  Keep a journal to find out what may trigger your migraine headaches. For example, write down:  What you eat and drink.  How much sleep you get.  Any change to your diet or medicines.  Limit alcohol consumption.  Quit smoking if you smoke.  Get 7 9 hours of sleep, or as recommended by your health care provider.  Limit stress.  Keep lights dim if bright lights bother you and make your migraines worse. SEEK IMMEDIATE MEDICAL CARE IF:   Your migraine becomes severe.  You have a fever.  You have a stiff neck.  You have vision loss.  You have muscular weakness or loss of muscle control.  You start losing your balance or have trouble walking.  You feel faint or pass out.  You have severe symptoms that are different from your first symptoms. MAKE SURE YOU:   Understand these instructions.  Will watch your condition.  Will get help right away if you are not doing well or get worse. Document Released: 10/25/2005 Document Revised: 08/15/2013 Document Reviewed: 07/02/2013 [facility] Patient Information 2014 [place].

## 2013-12-03 NOTE — Addendum Note (Signed)
Addended by: Linton Rump on: 12/03/2013 09:44 AM   Modules accepted: Orders

## 2013-12-05 ENCOUNTER — Encounter: Payer: Self-pay | Admitting: Women's Health

## 2013-12-24 ENCOUNTER — Other Ambulatory Visit: Payer: Self-pay | Admitting: Obstetrics and Gynecology

## 2013-12-24 ENCOUNTER — Ambulatory Visit (INDEPENDENT_AMBULATORY_CARE_PROVIDER_SITE_OTHER): Payer: BC Managed Care – PPO

## 2013-12-24 ENCOUNTER — Encounter (HOSPITAL_COMMUNITY): Payer: Self-pay | Admitting: Obstetrics and Gynecology

## 2013-12-24 ENCOUNTER — Other Ambulatory Visit: Payer: Self-pay | Admitting: Women's Health

## 2013-12-24 ENCOUNTER — Observation Stay (HOSPITAL_COMMUNITY)
Admission: AD | Admit: 2013-12-24 | Discharge: 2013-12-26 | Disposition: A | Discharge status: Home / Self Care | Payer: BC Managed Care – PPO | Source: Ambulatory Visit | Attending: Obstetrics and Gynecology | Admitting: Obstetrics and Gynecology

## 2013-12-24 ENCOUNTER — Ambulatory Visit (HOSPITAL_COMMUNITY): Payer: BC Managed Care – PPO | Admitting: Obstetrics and Gynecology

## 2013-12-24 ENCOUNTER — Encounter (INDEPENDENT_AMBULATORY_CARE_PROVIDER_SITE_OTHER): Payer: Self-pay

## 2013-12-24 DIAGNOSIS — Z1389 Encounter for screening for other disorder: Secondary | ICD-10-CM

## 2013-12-24 DIAGNOSIS — O3430 Maternal care for cervical incompetence, unspecified trimester: Principal | ICD-10-CM | POA: Insufficient documentation

## 2013-12-24 DIAGNOSIS — Z87891 Personal history of nicotine dependence: Secondary | ICD-10-CM | POA: Insufficient documentation

## 2013-12-24 DIAGNOSIS — D72829 Elevated white blood cell count, unspecified: Secondary | ICD-10-CM | POA: Insufficient documentation

## 2013-12-24 DIAGNOSIS — O26879 Cervical shortening, unspecified trimester: Secondary | ICD-10-CM

## 2013-12-24 DIAGNOSIS — Z3400 Encounter for supervision of normal first pregnancy, unspecified trimester: Secondary | ICD-10-CM

## 2013-12-24 DIAGNOSIS — M545 Low back pain, unspecified: Secondary | ICD-10-CM | POA: Insufficient documentation

## 2013-12-24 DIAGNOSIS — N949 Unspecified condition associated with female genital organs and menstrual cycle: Secondary | ICD-10-CM | POA: Insufficient documentation

## 2013-12-24 LAB — CBC
HEMATOCRIT: 35 % — AB (ref 36.0–46.0)
Hemoglobin: 12.3 g/dL (ref 12.0–15.0)
MCH: 34.7 pg — ABNORMAL HIGH (ref 26.0–34.0)
MCHC: 35.1 g/dL (ref 30.0–36.0)
MCV: 98.9 fL (ref 78.0–100.0)
Platelets: 282 10*3/uL (ref 150–400)
RBC: 3.54 MIL/uL — ABNORMAL LOW (ref 3.87–5.11)
RDW: 12.3 % (ref 11.5–15.5)
WBC: 18.9 10*3/uL — AB (ref 4.0–10.5)

## 2013-12-24 LAB — URINALYSIS, ROUTINE W REFLEX MICROSCOPIC
BILIRUBIN URINE: NEGATIVE
Glucose, UA: NEGATIVE mg/dL
Hgb urine dipstick: NEGATIVE
Ketones, ur: NEGATIVE mg/dL
Leukocytes, UA: NEGATIVE
NITRITE: NEGATIVE
PH: 7 (ref 5.0–8.0)
Protein, ur: NEGATIVE mg/dL
Specific Gravity, Urine: <1.005 — ABNORMAL LOW (ref 1.005–1.030)
Urobilinogen, UA: 0.2 mg/dL (ref 0.0–1.0)

## 2013-12-24 LAB — TYPE AND SCREEN
ABO/RH(D): A POS
ANTIBODY SCREEN: NEGATIVE

## 2013-12-24 MED ORDER — ZOLPIDEM TARTRATE 5 MG PO TABS
5.0000 mg | ORAL_TABLET | Freq: Every evening | ORAL | Status: DC | PRN
  Administered 2013-12-24 – 2013-12-25 (×2): 5 mg via ORAL
  Filled 2013-12-24 (×2): qty 1

## 2013-12-24 MED ORDER — LORATADINE 10 MG PO TABS
10.0000 mg | ORAL_TABLET | Freq: Every day | ORAL | Status: DC | PRN
  Administered 2013-12-24 – 2013-12-26 (×2): 10 mg via ORAL
  Filled 2013-12-24 (×2): qty 1

## 2013-12-24 MED ORDER — CALCIUM CARBONATE ANTACID 500 MG PO CHEW: 2.0000 | CHEWABLE_TABLET | ORAL | Status: DC | PRN

## 2013-12-24 MED ORDER — DOCUSATE SODIUM 100 MG PO CAPS
100.0000 mg | ORAL_CAPSULE | Freq: Every day | ORAL | Status: DC
  Filled 2013-12-24: qty 1

## 2013-12-24 MED ORDER — ACETAMINOPHEN 325 MG PO TABS
650.0000 mg | ORAL_TABLET | ORAL | Status: DC | PRN
  Administered 2013-12-24 – 2013-12-26 (×5): 650 mg via ORAL
  Filled 2013-12-24 (×6): qty 2

## 2013-12-24 MED ORDER — PRENATAL MULTIVITAMIN CH
1.0000 | ORAL_TABLET | Freq: Every day | ORAL | Status: DC
  Filled 2013-12-24: qty 1

## 2013-12-24 MED ORDER — SODIUM CHLORIDE 0.9 % IV SOLN
INTRAVENOUS | Status: DC
  Administered 2013-12-24: 15:00:00 via INTRAVENOUS

## 2013-12-24 MED ORDER — PRENATAL MULTIVITAMIN CH
1.0000 | ORAL_TABLET | Freq: Every day | ORAL | Status: DC
  Administered 2013-12-24: 1 via ORAL
  Filled 2013-12-24: qty 1

## 2013-12-24 NOTE — Progress Notes (Signed)
Ultrasound witnessed, Confirming cervical incompetence. Speculum exam shows membranes visible even with external os of dilated cervix. Mucoid nonpurulent vaginal discharge present, no leaking of fluid.  IMP Cervical incomptetence, at [redacted]w[redacted]d. Plan: admit to [facility], [place]. Monitor to r/o contractions, place foley bulb, place in [place], If not contracting, will proceed to rescue cerclage placement,likely this PM. Ante notified of admit.Sherry Arias jvferg

## 2013-12-24 NOTE — H&P (Signed)
Sherry Arias is a 33 y.o. female presenting for cervical incompetence, rule out preterm labor/ infection. History Sherry Arias is a 77 yr G1 P0 at [redacted]w[redacted]d by initial dating and u/s who was seen for routine anatomy scan this morning, and found on u/s to have membranes protruding to the ext os thru a cervix dilated to approx 2-3 cm.  NO srom or bleeding . The pt did notice an increased vaginal discharge this weekend, and has had a mild cold with congestion for the last few days. She denies fever or chills, vaginal bleeding or gush of fluid Pt has been an uneventful pregnancy to date, with u/s assigned EDC. Pt denies recent coitus, last bm this am, pt with no bleeding or srom. Pt admitted for overnight observation, for probable effort at rescue cerclage in a.m.  OB History   Grav Para Term Preterm Abortions TAB SAB Ect Mult Living   1              Past Medical History  Diagnosis Date  . Anxiety   . Depression   . Migraine   . Eczema   . Seasonal allergies   . Supervision of normal pregnancy in second trimester 11/09/2013   Past Surgical History  Procedure Laterality Date  . Wisdom tooth extraction     Family History: family history includes Cervical cancer in her mother; Diabetes in her father; Heart disease in her father and paternal grandfather; Hypertension in her father; Mental illness in her mother; Ovarian cancer in her mother. Social History:  reports that she quit smoking about 6 months ago. She has never used smokeless tobacco. She reports that she does not drink alcohol or use illicit drugs.   Prenatal Transfer Tool    ROS See hpi   Blood pressure 110/57, pulse 95, temperature 98.2 F (36.8 C), temperature source Oral, resp. rate 18, height 5\' 5"  (1.651 m), weight 218 lb (98.884 kg), last menstrual period 08/08/2013. Exam Physical Exam  Constitutional: She appears well-developed.  Eyes: Pupils are equal, round, and reactive to light.  Neck: Normal range of motion.   Cardiovascular: Normal rate.   Respiratory: Effort normal and breath sounds normal.  GI: Soft.  Genitourinary:  Generous vag discharge, no srom. Good vag length, cervix open 2-3 cm with membranes to the ext os. No bleeding or srom    Prenatal labs: ABO, Rh: A/POS/-- (01/02 0954) Antibody: NEG (01/02 0954) Rubella: 1.00 (01/02 0954) RPR: NON REAC (01/02 0954)  HBsAg: NEGATIVE (01/02 0954)  HIV: NON REACTIVE (01/02 0954)  GBS:     Assessment/Plan: Cervical incompetence in G1 PO R/o  Infection, R./o ptl P observe overnight       Reck cbc,  If no labor , will proceed to Rescue cerclage.  Pt and husband made very aware of the dire risks to pregnancy due theto the cervical incompetence and risk of membrane rupture, previable delivery, and wish to proceed to [facility] rescue cerclage in a.m.   Sherry Arias 12/24/2013, 5:07 PM

## 2013-12-24 NOTE — Progress Notes (Signed)
U/S(19+5wks)-active breech fetus, meas c/w dates, fluid wnl, posterior Gr 0 placenta, bilateral adnexa wnl, no major abnl noted although sub-optimal view of cardiac OFT's, CX-little to no functional cx noted with fetal lower extremities noted within on vaginal exam

## 2013-12-24 NOTE — Progress Notes (Signed)
Sherry Arias and her husband, [name], are scared for their baby.  They have an understanding of the gravity of the situation and that baby is not yet viable and they are nervous about how things weill go.  In the meantime, her father is at [facility] following a stroke.    I offered emotional support and helped to find answers to her questions about the procedure this evening.    [employer] Pager, [phone] 4:25 PM   12/24/13 1600  Clinical Encounter Type  Visited With Patient and family together  Visit Type Spiritual support  Referral From Nurse  Spiritual Encounters  Spiritual Needs Emotional  Stress Factors  Patient Stress Factors Loss of control

## 2013-12-24 NOTE — Patient Instructions (Signed)
Report to [facility] for direct admit to Antenatal

## 2013-12-25 ENCOUNTER — Encounter (HOSPITAL_COMMUNITY): Payer: Self-pay | Admitting: Anesthesiology

## 2013-12-25 ENCOUNTER — Encounter (HOSPITAL_COMMUNITY): Payer: BC Managed Care – PPO | Admitting: Anesthesiology

## 2013-12-25 ENCOUNTER — Encounter (HOSPITAL_COMMUNITY)
Admission: AD | Disposition: A | Discharge status: Home / Self Care | Payer: Self-pay | Source: Ambulatory Visit | Attending: Obstetrics and Gynecology

## 2013-12-25 ENCOUNTER — Observation Stay (HOSPITAL_COMMUNITY): Payer: BC Managed Care – PPO | Admitting: Anesthesiology

## 2013-12-25 DIAGNOSIS — O3430 Maternal care for cervical incompetence, unspecified trimester: Principal | ICD-10-CM

## 2013-12-25 HISTORY — PX: CERVICAL CERCLAGE: SHX1329

## 2013-12-25 LAB — CBC WITH DIFFERENTIAL/PLATELET
Basophils Absolute: 0 10*3/uL (ref 0.0–0.1)
Basophils Relative: 0 % (ref 0–1)
EOS ABS: 0.4 10*3/uL (ref 0.0–0.7)
Eosinophils Relative: 2 % (ref 0–5)
HCT: 32.2 % — ABNORMAL LOW (ref 36.0–46.0)
HEMOGLOBIN: 11.1 g/dL — AB (ref 12.0–15.0)
LYMPHS ABS: 2.2 10*3/uL (ref 0.7–4.0)
LYMPHS PCT: 13 % (ref 12–46)
MCH: 34.2 pg — AB (ref 26.0–34.0)
MCHC: 34.5 g/dL (ref 30.0–36.0)
MCV: 99.1 fL (ref 78.0–100.0)
MONOS PCT: 7 % (ref 3–12)
Monocytes Absolute: 1.1 10*3/uL — ABNORMAL HIGH (ref 0.1–1.0)
NEUTROS ABS: 13 10*3/uL — AB (ref 1.7–7.7)
NEUTROS PCT: 78 % — AB (ref 43–77)
Platelets: 266 10*3/uL (ref 150–400)
RBC: 3.25 MIL/uL — ABNORMAL LOW (ref 3.87–5.11)
RDW: 12.6 % (ref 11.5–15.5)
WBC: 16.6 10*3/uL — AB (ref 4.0–10.5)

## 2013-12-25 LAB — ABO/RH: ABO/RH(D): A POS

## 2013-12-25 SURGERY — CERCLAGE, CERVIX, VAGINAL APPROACH
Anesthesia: Spinal | Site: Vagina

## 2013-12-25 MED ORDER — CALCIUM CARBONATE ANTACID 500 MG PO CHEW: 2.0000 | CHEWABLE_TABLET | ORAL | Status: DC | PRN

## 2013-12-25 MED ORDER — DOCUSATE SODIUM 100 MG PO CAPS
100.0000 mg | ORAL_CAPSULE | Freq: Two times a day (BID) | ORAL | Status: DC
  Administered 2013-12-25: 100 mg via ORAL
  Filled 2013-12-25: qty 1

## 2013-12-25 MED ORDER — MEPERIDINE HCL 25 MG/ML IJ SOLN: 6.2500 mg | INTRAMUSCULAR | Status: DC | PRN

## 2013-12-25 MED ORDER — NITROGLYCERIN 0.4 MG/SPRAY TL SOLN
Status: AC
  Filled 2013-12-25: qty 4.9

## 2013-12-25 MED ORDER — FENTANYL CITRATE 0.05 MG/ML IJ SOLN
INTRAMUSCULAR | Status: AC
  Administered 2013-12-25: 50 ug via INTRAVENOUS
  Filled 2013-12-25: qty 2

## 2013-12-25 MED ORDER — 0.9 % SODIUM CHLORIDE (POUR BTL) OPTIME
TOPICAL | Status: DC | PRN
  Administered 2013-12-25: 1000 mL

## 2013-12-25 MED ORDER — LACTATED RINGERS IV SOLN
INTRAVENOUS | Status: DC | PRN
  Administered 2013-12-25 (×2): via INTRAVENOUS

## 2013-12-25 MED ORDER — PHENYLEPHRINE 40 MCG/ML (10ML) SYRINGE FOR IV PUSH (FOR BLOOD PRESSURE SUPPORT)
PREFILLED_SYRINGE | INTRAVENOUS | Status: AC
  Filled 2013-12-25: qty 15

## 2013-12-25 MED ORDER — EPHEDRINE 5 MG/ML INJ
INTRAVENOUS | Status: AC
  Filled 2013-12-25: qty 10

## 2013-12-25 MED ORDER — PROMETHAZINE HCL 25 MG/ML IJ SOLN
25.0000 mg | Freq: Four times a day (QID) | INTRAMUSCULAR | Status: DC | PRN
  Administered 2013-12-25: 25 mg via INTRAVENOUS
  Filled 2013-12-25: qty 1

## 2013-12-25 MED ORDER — FENTANYL CITRATE 0.05 MG/ML IJ SOLN
25.0000 ug | INTRAMUSCULAR | Status: DC | PRN
  Administered 2013-12-25 (×2): 50 ug via INTRAVENOUS

## 2013-12-25 MED ORDER — PHENYLEPHRINE 40 MCG/ML (10ML) SYRINGE FOR IV PUSH (FOR BLOOD PRESSURE SUPPORT)
PREFILLED_SYRINGE | INTRAVENOUS | Status: AC
  Filled 2013-12-25: qty 5

## 2013-12-25 MED ORDER — METOCLOPRAMIDE HCL 5 MG/ML IJ SOLN: 10.0000 mg | Freq: Once | INTRAMUSCULAR | Status: DC | PRN

## 2013-12-25 MED ORDER — CEFAZOLIN SODIUM-DEXTROSE 2-3 GM-% IV SOLR
2.0000 g | Freq: Once | INTRAVENOUS | Status: AC
  Administered 2013-12-25: 2 g via INTRAVENOUS
  Filled 2013-12-25: qty 50

## 2013-12-25 MED ORDER — INDOMETHACIN 50 MG PO CAPS
50.0000 mg | ORAL_CAPSULE | Freq: Three times a day (TID) | ORAL | Status: DC
Start: 2013-12-25 — End: 2013-12-25
  Filled 2013-12-25 (×3): qty 1

## 2013-12-25 MED ORDER — NITROGLYCERIN 0.4 MG/SPRAY TL SOLN
Status: DC | PRN
  Administered 2013-12-25: 2 via SUBLINGUAL
  Administered 2013-12-25: 1 via SUBLINGUAL

## 2013-12-25 MED ORDER — BUPIVACAINE IN DEXTROSE 0.75-8.25 % IT SOLN
INTRATHECAL | Status: DC | PRN
  Administered 2013-12-25: 9 mg via INTRATHECAL

## 2013-12-25 MED ORDER — FENTANYL CITRATE 0.05 MG/ML IJ SOLN: 50.0000 ug | Freq: Once | INTRAMUSCULAR | Status: DC

## 2013-12-25 MED ORDER — SODIUM CHLORIDE 0.9 % IV SOLN
INTRAVENOUS | Status: DC
Start: 2013-12-25 — End: 2013-12-26
  Administered 2013-12-25: 21:00:00 via INTRAVENOUS

## 2013-12-25 MED ORDER — EPHEDRINE SULFATE 50 MG/ML IJ SOLN
INTRAMUSCULAR | Status: DC | PRN
  Administered 2013-12-25: 10 mg via INTRAVENOUS
  Administered 2013-12-25: 15 mg via INTRAVENOUS

## 2013-12-25 MED ORDER — PHENYLEPHRINE HCL 10 MG/ML IJ SOLN
INTRAMUSCULAR | Status: DC | PRN
  Administered 2013-12-25 (×4): 80 ug via INTRAVENOUS
  Administered 2013-12-25: 40 ug via INTRAVENOUS
  Administered 2013-12-25: 80 ug via INTRAVENOUS
  Administered 2013-12-25: 40 ug via INTRAVENOUS
  Administered 2013-12-25: 80 ug via INTRAVENOUS
  Administered 2013-12-25 (×2): 40 ug via INTRAVENOUS
  Administered 2013-12-25 (×2): 120 ug via INTRAVENOUS

## 2013-12-25 MED ORDER — FENTANYL CITRATE 0.05 MG/ML IJ SOLN
50.0000 ug | Freq: Once | INTRAMUSCULAR | Status: AC
  Administered 2013-12-25: 50 ug via INTRAVENOUS
  Filled 2013-12-25: qty 2

## 2013-12-25 SURGICAL SUPPLY — 22 items
CATH FOLEY 2WAY SLVR 30CC 16FR (CATHETERS) ×1 IMPLANT
CATH FOLEY 3WAY 30CC 16FR (CATHETERS) ×1 IMPLANT
CATH ROBINSON RED A/P 16FR (CATHETERS) IMPLANT
CLOTH BEACON ORANGE TIMEOUT ST (SAFETY) ×2 IMPLANT
COUNTER NEEDLE 1200 MAGNETIC (NEEDLE) ×1 IMPLANT
GLOVE BIO SURGEON ST LM GN SZ9 (GLOVE) ×2 IMPLANT
GLOVE BIOGEL PI IND STRL 9 (GLOVE) ×2 IMPLANT
GLOVE BIOGEL PI INDICATOR 9 (GLOVE) ×2
GOWN PREVENTION PLUS XLARGE (GOWN DISPOSABLE) ×2 IMPLANT
GOWN STRL REUS W/TWL 2XL LVL3 (GOWN DISPOSABLE) ×2 IMPLANT
GOWN STRL REUS W/TWL LRG LVL3 (GOWN DISPOSABLE) ×2 IMPLANT
PACK VAGINAL MINOR WOMEN LF (CUSTOM PROCEDURE TRAY) ×2 IMPLANT
PAD OB MATERNITY 4.3X12.25 (PERSONAL CARE ITEMS) ×2 IMPLANT
PAD PREP 24X48 CUFFED NSTRL (MISCELLANEOUS) ×2 IMPLANT
SUT PROLENE 1 CT 1 30 (SUTURE) ×1 IMPLANT
SUT SILK 2 0 SH (SUTURE) IMPLANT
SYR 30ML LL (SYRINGE) ×1 IMPLANT
SYR BULB IRRIGATION 50ML (SYRINGE) ×1 IMPLANT
TOWEL OR 17X24 6PK STRL BLUE (TOWEL DISPOSABLE) ×4 IMPLANT
TUBING NON-CON 1/4 X 20 CONN (TUBING) ×1 IMPLANT
WATER STERILE IRR 1000ML POUR (IV SOLUTION) ×1 IMPLANT
YANKAUER SUCT BULB TIP NO VENT (SUCTIONS) ×1 IMPLANT

## 2013-12-25 NOTE — Anesthesia Preprocedure Evaluation (Addendum)
Anesthesia Evaluation  Patient identified by MRN, date of birth, ID band Patient awake    Reviewed: Allergy & Precautions, H&P , NPO status , Patient's Chart, lab work & pertinent test results  Airway Mallampati: III TM Distance: >3 FB Neck ROM: Full    Dental no notable dental hx. (+) Teeth Intact   Pulmonary former smoker,  breath sounds clear to auscultation  Pulmonary exam normal       Cardiovascular negative cardio ROS  Rhythm:Regular Rate:Normal     Neuro/Psych  Headaches, PSYCHIATRIC DISORDERS Anxiety Depression    GI/Hepatic negative GI ROS, Neg liver ROS,   Endo/Other  negative endocrine ROSObesity  Renal/GU negative Renal ROS     Musculoskeletal negative musculoskeletal ROS (+)   Abdominal (+) + obese,   Peds  Hematology negative hematology ROS (+)   Anesthesia Other Findings   Reproductive/Obstetrics (+) Pregnancy Incompetent cervix 20 weeks 6/7                          Anesthesia Physical Anesthesia Plan  ASA: II  Anesthesia Plan: Spinal   Post-op Pain Management:    Induction:   Airway Management Planned: Natural Airway  Additional Equipment:   Intra-op Plan:   Post-operative Plan:   Informed Consent: I have reviewed the patients History and Physical, chart, labs and discussed the procedure including the risks, benefits and alternatives for the proposed anesthesia with the patient or authorized representative who has indicated his/her understanding and acceptance.     Plan Discussed with: Anesthesiologist, CRNA and Surgeon  Anesthesia Plan Comments:         Anesthesia Quick Evaluation

## 2013-12-25 NOTE — Op Note (Signed)
12/24/2013 - 12/25/2013  12:40 PM  PATIENT:  Sherry Arias  33 y.o. female  PRE-OPERATIVE DIAGNOSIS:  rescue cerclage, incompetent cervix, pregnancy 20w6 days  POST-OPERATIVE DIAGNOSIS:  incompetent cervix pregnancy 20 w6days  PROCEDURE:  Procedure(s): CERCLAGE CERVICAL (N/A)[facility]  Single stitch  SURGEON:  Surgeon(s) and Role:    * Jonnie Kind, MD - Primary  PHYSICIAN ASSISTANT:    Details of procedure; it was taken the operating room, spinal anesthesia introduced,  perineum prepped and draped,, patient given the first 3 doses of nitroglycerin sublingual, timeout conducted and procedure confirmed by surgical team. Open sided bivalve speculum was inserted into the vagina revealing bulging bag of waters past the cervix to the point of the anterior cervical lip could not be visualized. After giving the second and third dose of nitroglycerin the anterior lip could be visualized adequately the ring forceps could be placed on the anterior lip. Efforts to place a Foley bulb through the cervical os and raised the bag of waters was not initially successful, so careful acetaminophen #1 Prolene stitch beginning at 9:00 proceeding clockwise around the cervix and a very superficial fashion using multiple bites was then performed. When the Prolene stitch was half way down, the Foley bulb could then be inserted the bag of waters elevated with the Foley bulb, and the posterior lip of cervix could then be visualized. The pursestring suture was completed posteriorly and then tied down with Foley bulb in place, then the Foley bulb removed from the intracervical location and forceps used to attempt placed through the cervix and it would not pass down indicating satisfactory closure of the cervix. Inspection was performed in order to consider a second stitch but was felt that the first stitch was as close to the cervicovaginal junction as could be reasonably achieved, so it was discontinued with single  circumferential stitch in place. Sponge and needle counts correct, patient recovery room in good condition

## 2013-12-25 NOTE — Transfer of Care (Signed)
Immediate Anesthesia Transfer of Care Note  Patient: Sherry Arias  Procedure(s) Performed: Procedure(s): CERCLAGE CERVICAL (N/A)  Patient Location: PACU  Anesthesia Type:Spinal  Level of Consciousness: awake, alert  and oriented  Airway & Oxygen Therapy: Patient Spontanous Breathing and Patient connected to nasal cannula oxygen  Post-op Assessment: Report given to PACU RN and Post -op Vital signs reviewed and stable  Post vital signs: Reviewed and stable  Complications: No apparent anesthesia complications

## 2013-12-25 NOTE — Interval H&P Note (Signed)
History and Physical Interval Note:  12/25/2013 11:11 AM  Sherry Arias  has presented today for surgery, with the diagnosis of rescue cerclage  The various methods of treatment have been discussed with the patient and family. After consideration of risks, benefits and other options for treatment, the patient has consented to  Procedure(s): CERCLAGE CERVICAL (N/A) as a surgical intervention .  The patient's history has been reviewed, patient examined, no change in status, stable for surgery.  I have reviewed the patient's chart and labs.  Questions were answered to the patient's satisfaction.     Jonnie Kind

## 2013-12-25 NOTE — Progress Notes (Signed)
12/25/13 1100  Clinical Encounter Type  Visited With Family;Patient not available (husband Sherry Arias, his parents)  Visit Type Spiritual support;Social support  Referral From [place] Needs Emotional  Stress Factors  Patient Stress Factors Loss of control;Major life changes   Followed up with spouse Sherry Arias and his parents in surgical waiting (while Sherry Arias was having procedure) to offer further spiritual and emotional support.  Family was appreciative, is aware of ongoing chaplain availability.  [place] will follow, but please also page as needed:  [phone].  Thank you.  [place], [place]

## 2013-12-25 NOTE — Progress Notes (Signed)
Pt off the monitor and on bedpan for BM.

## 2013-12-25 NOTE — Anesthesia Postprocedure Evaluation (Signed)
  Anesthesia Post-op Note  Patient: Sherry Arias  Procedure(s) Performed: Procedure(s): CERCLAGE CERVICAL (N/A)  Patient Location: PACU  Anesthesia Type:Spinal  Level of Consciousness: awake, alert  and oriented  Airway and Oxygen Therapy: Patient Spontanous Breathing  Post-op Pain: none  Post-op Assessment: Post-op Vital signs reviewed, Patient's Cardiovascular Status Stable, Respiratory Function Stable, Patent Airway, No signs of Nausea or vomiting, Pain level controlled, No headache and No backache  Post-op Vital Signs: Reviewed and stable  Complications: No apparent anesthesia complications

## 2013-12-25 NOTE — Progress Notes (Signed)
UR completed 

## 2013-12-25 NOTE — Brief Op Note (Signed)
12/24/2013 - 12/25/2013  12:40 PM  PATIENT:  Sherry Arias  33 y.o. female  PRE-OPERATIVE DIAGNOSIS:  rescue cerclage, incompetent cervix, pregnancy 20w6 days  POST-OPERATIVE DIAGNOSIS:  incompetent cervix pregnancy 20 w6days  PROCEDURE:  Procedure(s): CERCLAGE CERVICAL (N/A)MCDonald  Single stitch  SURGEON:  Surgeon(s) and Role:    * Jonnie Kind, MD - Primary  PHYSICIAN ASSISTANT:   ASSISTANTS: none   ANESTHESIA:   spinal  EBL:  Total I/O In: 2000 [I.V.:2000] Out: -   BLOOD ADMINISTERED:none  DRAINS: Urinary Catheter (Foley)   LOCAL MEDICATIONS USED:  NONE  SPECIMEN:  No Specimen  DISPOSITION OF SPECIMEN:  N/A  COUNTS:  YES  TOURNIQUET:  * No tourniquets in log *  DICTATION: .Dragon Dictation  PLAN OF CARE: has admit orders in place  PATIENT DISPOSITION:  PACU - hemodynamically stable.   Delay start of Pharmacological VTE agent (>24hrs) due to surgical blood loss or risk of bleeding: not applicable

## 2013-12-25 NOTE — Progress Notes (Signed)
Patient ID: Sherry Arias, female   DOB: [birth year], 33 y.o.   MRN: [id number] Pt with no complaints overnight.  She is scheduled for a cerclage at 1100 with Dr. Newman Pies.  She reports that she feels 'better today than she has in a while'. She denies bleeding or LOF or abd/pelvic pain. Exam not done.  For cerclage as above  Jazira Maloney L. Harraway-Smith, M.D., [place] June

## 2013-12-25 NOTE — Anesthesia Procedure Notes (Signed)
Spinal  Patient location during procedure: OR Start time: 12/25/2013 11:22 AM Staffing Anesthesiologist: Pollyann Roa A. Preanesthetic Checklist Completed: patient identified, site marked, surgical consent, pre-op evaluation, timeout performed, IV checked, risks and benefits discussed and monitors and equipment checked Spinal Block Patient position: right lateral decubitus Prep: DuraPrep Patient monitoring: heart rate, cardiac monitor, continuous pulse ox and blood pressure Approach: midline Location: L3-4 Injection technique: single-shot Needle Needle type: Sprotte  Needle gauge: 24 G Needle length: 9 cm Needle insertion depth: 7 cm Assessment Sensory level: T6 Additional Notes Patient tolerated procedure well. Adequate sensory level.

## 2013-12-26 ENCOUNTER — Observation Stay (HOSPITAL_COMMUNITY): Payer: BC Managed Care – PPO

## 2013-12-26 ENCOUNTER — Encounter (HOSPITAL_COMMUNITY): Payer: Self-pay | Admitting: Obstetrics and Gynecology

## 2013-12-26 LAB — CBC
HCT: 31.2 % — ABNORMAL LOW (ref 36.0–46.0)
HEMOGLOBIN: 10.8 g/dL — AB (ref 12.0–15.0)
MCH: 34.2 pg — ABNORMAL HIGH (ref 26.0–34.0)
MCHC: 34.6 g/dL (ref 30.0–36.0)
MCV: 98.7 fL (ref 78.0–100.0)
Platelets: 257 10*3/uL (ref 150–400)
RBC: 3.16 MIL/uL — ABNORMAL LOW (ref 3.87–5.11)
RDW: 12.5 % (ref 11.5–15.5)
WBC: 17.8 10*3/uL — AB (ref 4.0–10.5)

## 2013-12-26 LAB — BASIC METABOLIC PANEL
BUN: 5 mg/dL — ABNORMAL LOW (ref 6–23)
CO2: 22 mEq/L (ref 19–32)
Calcium: 8.3 mg/dL — ABNORMAL LOW (ref 8.4–10.5)
Chloride: 105 mEq/L (ref 96–112)
Creatinine, Ser: 0.48 mg/dL — ABNORMAL LOW (ref 0.50–1.10)
GFR calc non Af Amer: >90 mL/min (ref >90)
GLUCOSE: 86 mg/dL (ref 70–99)
POTASSIUM: 3.8 meq/L (ref 3.7–5.3)
SODIUM: 138 meq/L (ref 137–147)

## 2013-12-26 MED ORDER — HYDROCODONE-ACETAMINOPHEN 5-325 MG PO TABS: 1.0000 | ORAL_TABLET | Freq: Four times a day (QID) | ORAL | Status: DC | PRN

## 2013-12-26 MED ORDER — DSS 100 MG PO CAPS: 100.0000 mg | ORAL_CAPSULE | Freq: Two times a day (BID) | ORAL | Status: DC

## 2013-12-26 MED ORDER — AZITHROMYCIN 500 MG PO TABS: 500.0000 mg | ORAL_TABLET | Freq: Every day | ORAL | Status: DC

## 2013-12-26 MED ORDER — CYCLOBENZAPRINE HCL 10 MG PO TABS
10.0000 mg | ORAL_TABLET | Freq: Three times a day (TID) | ORAL | Status: DC | PRN
  Administered 2013-12-26: 10 mg via ORAL
  Filled 2013-12-26: qty 1

## 2013-12-26 MED ORDER — AZITHROMYCIN 500 MG PO TABS
500.0000 mg | ORAL_TABLET | Freq: Every day | ORAL | Status: DC
  Administered 2013-12-26: 500 mg via ORAL
  Filled 2013-12-26: qty 1

## 2013-12-26 NOTE — Addendum Note (Signed)
Addendum created 12/26/13 1410 by Georgeanne Nim, CRNA   Modules edited: Notes Section   Notes Section:  File: [id number]

## 2013-12-26 NOTE — Progress Notes (Signed)
Pt sitting up in the bed eating breakfast  

## 2013-12-26 NOTE — Discharge Instructions (Signed)
Cerclage Cerclage of the cervix is a surgical procedure for an incompetent cervix. An incompetent cervix is a weak cervix that opens up before labor begins. Cerclage of the cervix sews the cervix closed during pregnancy.  LET [facility] CARE PROVIDER KNOW ABOUT:   Any allergies you have.  All medicines you are taking, including vitamins, herbs, eye drops, creams, and over-the-counter medicines.  Previous problems you or members of your family have had with the use of anesthetics.  Any blood disorders you have.  Previous surgeries you have had.  Medical conditions you have.  Any recent colds or infections. RISKS AND COMPLICATIONS  Generally, this is a safe procedure. However, as with any procedure, complications can occur. Possible complications include:  Infection.  Bleeding.  Rupturing the amniotic sac (membranes).  Going into early labor and delivery.  Problems with the anesthesia.  Infection of the amniotic sac. BEFORE THE PROCEDURE   Ask your health care provider about changing or stopping your medicines.  Do not eat or drink anything for 6 8 hours before the procedure.  Arrange for someone to drive you home after the procedure. PROCEDURE   An IV tube will be placed in your vein. You will be given a sedative to help you relax. You will be given a medicine that makes you sleep through the procedure (general anesthetic) or a medicine injected into your spine that numbs your body below the waist (spinal or epidural anesthetic). You will be asleep or be numbed through the entire procedure.  A speculum will be placed in vagina to visualize your cervix.  The cervix is then grasped and stitched closed tightly.  Ultrasound may be used to guide the procedure and monitor the baby. AFTER THE PROCEDURE   You will go to a recovery room where you and your unborn baby are monitored. Once you are awake, stable, and taking fluids well, you will be allowed to return to your  room.  You will usually stay in the hospital overnight.  You may get an injection of progesterone to prevent uterine contractions.  You may be given pain-relieving medicines to take with you when you go home.  Have someone drive you home and stay with you for up to 2 days. Document Released: 10/07/2008 Document Revised: 06/27/2013 Document Reviewed: 05/16/2013 [facility] Patient Information 2014 [place].  Cervical Insufficiency  Cervical insufficiency is when the cervix is weak and starts to open (dilate) and thin (efface) before the pregnancy is at term and without labor starting. This is also called incompetent cervix. It can happen in the second or third trimester when the fetus starts putting pressure on the cervix. Cervical insufficiency can lead to a miscarriage, preterm premature rupture of the membranes (PPROM), or having the baby early (preterm birth).  RISK FACTORS You may be more likely to develop cervical insufficiency if:  You have a shorter cervix than normal.  Damage or injury occurred to your cervix from a past pregnancy or surgery.  You were born with a cervical defect.  You have had procedure done on the cervix, such as cervical biopsy.  You have a history of cervical insufficiency.  You have a history of PPROM.  You have ended several past pregnancies through abortion.  You were exposed to the drug diethylstilbestrol (DES). SYMPTOMS Often times, women do not have any symptoms. Other times, woman may only have mild symptoms that often start between week 14 through 20. The symptoms may last several days or weeks. These symptoms include:  Light spotting or bleeding from the vagina.  Pelvic pressure.  A change in vaginal discharge, such as discharge that changes from clear, white, or light yellow to pink or tan.  Back pain.  Abdominal pain or cramping. DIAGNOSIS Cervical insufficiency cannot be diagnosed before you become pregnant. Once you are  pregnant, your caregiver will ask about your medical history and if you have had any problems in past pregnancies. Tell your caregiver about any procedures performed on your cervix or if you have a history of miscarriages or cervical insufficiency. If your caregiver thinks you are at high risk for cervical insufficiency or show signs of cervical insufficiency, he or she may:  Perform a pelvic exam. This will check for:  The presence of the membranes (amniotic sac) coming out of the cervix.  Cervical abnormalities.  Cervical injuries.  The presence of contractions.  Perform an ultrasonography (commonly called ultrasound) to measure the length and thickness of the cervix. TREATMENT If you have been diagnosed with cervical insufficiency, your caregiver may recommend:  Limiting physical activity.  Bed rest at home or in the hospital.  Pelvic rest, which means no sexual intercourse or placing anything in the vagina.  Cerclage to sew the cervix closed and prevent it from opening too early. The stitches (sutures) are removed between weeks 36 and 38 to avoid problems during labor. Cerclage may be recommended during pregnancy if you have had a history of miscarriages or preterm births without a known cause. It may also be recommended if you have a short cervix that was identified by ultrasound or if your caregiver has found that your cervix has dilated before 24 weeks of pregnancy. Limiting physical activity and bed rest may or may not help prevent a preterm birth. WHEN SHOULD YOU SEEK IMMEDIATE MEDICAL CARE?  Seek immediate medical care if you show any symptoms of cervical insufficiency. You will need to go to the hospital to get checked immediately. Document Released: 10/25/2005 Document Revised: 06/27/2013 Document Reviewed: 01/01/2013 [facility] Patient Information 2014 [place], [place].  Cerclage Cerclage of the cervix is a surgical procedure for an incompetent cervix. An incompetent cervix is  a weak cervix that opens up before labor begins. Cerclage of the cervix sews the cervix closed during pregnancy.  LET [facility] CARE PROVIDER KNOW ABOUT:   Any allergies you have.  All medicines you are taking, including vitamins, herbs, eye drops, creams, and over-the-counter medicines.  Previous problems you or members of your family have had with the use of anesthetics.  Any blood disorders you have.  Previous surgeries you have had.  Medical conditions you have.  Any recent colds or infections. RISKS AND COMPLICATIONS  Generally, this is a safe procedure. However, as with any procedure, complications can occur. Possible complications include:  Infection.  Bleeding.  Rupturing the amniotic sac (membranes).  Going into early labor and delivery.  Problems with the anesthesia.  Infection of the amniotic sac. BEFORE THE PROCEDURE   Ask your health care provider about changing or stopping your medicines.  Do not eat or drink anything for 6 8 hours before the procedure.  Arrange for someone to drive you home after the procedure. PROCEDURE   An IV tube will be placed in your vein. You will be given a sedative to help you relax. You will be given a medicine that makes you sleep through the procedure (general anesthetic) or a medicine injected into your spine that numbs your body below the waist (spinal or epidural anesthetic). You  will be asleep or be numbed through the entire procedure.  A speculum will be placed in vagina to visualize your cervix.  The cervix is then grasped and stitched closed tightly.  Ultrasound may be used to guide the procedure and monitor the baby. AFTER THE PROCEDURE   You will go to a recovery room where you and your unborn baby are monitored. Once you are awake, stable, and taking fluids well, you will be allowed to return to your room.  You will usually stay in the hospital overnight.  You may get an injection of progesterone to prevent  uterine contractions.  You may be given pain-relieving medicines to take with you when you go home.  Have someone drive you home and stay with you for up to 2 days. Document Released: 10/07/2008 Document Revised: 06/27/2013 Document Reviewed: 05/16/2013 [facility] Patient Information 2014 [place].

## 2013-12-26 NOTE — Progress Notes (Signed)
Moderate amount of milky discharge and scant VB on peripad.  Pad changed

## 2013-12-26 NOTE — Progress Notes (Signed)
1 Day Post-Op Procedure(s) (LRB): CERCLAGE CERVICAL (N/A)  Subjective: Patient reports RLq pain that has improved significantly with having a good BM this morning. She also has mild low back pain , not in CVAT region..    Objective: I have reviewed patient's vital signs, medications and labs. BP 101/57  Pulse 93  Temp(Src) 98.1 F (36.7 C) (Oral)  Resp 20  Ht 5\' 5"  (1.651 m)  Wt 218 lb (98.884 kg)  BMI 36.28 kg/m2  SpO2 100%  LMP 08/08/2013  CBC    Component Value Date/Time   WBC 17.8* 12/26/2013 0520   RBC 3.16* 12/26/2013 0520   HGB 10.8* 12/26/2013 0520   HCT 31.2* 12/26/2013 0520   PLT 257 12/26/2013 0520   MCV 98.7 12/26/2013 0520   MCH 34.2* 12/26/2013 0520   MCHC 34.6 12/26/2013 0520   RDW 12.5 12/26/2013 0520   LYMPHSABS 2.2 12/25/2013 0605   MONOABS 1.1* 12/25/2013 0605   EOSABS 0.4 12/25/2013 0605   BASOSABS 0.0 12/25/2013 0605    CMP     Component Value Date/Time   NA 138 12/26/2013 0520   K 3.8 12/26/2013 0520   CL 105 12/26/2013 0520   CO2 22 12/26/2013 0520   GLUCOSE 86 12/26/2013 0520   BUN 5* 12/26/2013 0520   CREATININE 0.48* 12/26/2013 0520   CREATININE 0.80 01/17/2013 1232   CALCIUM 8.3* 12/26/2013 0520   PROT 6.2 01/17/2013 1232   ALBUMIN 3.8 01/17/2013 1232   AST 20 01/17/2013 1232   ALT 20 01/17/2013 1232   ALKPHOS 35* 01/17/2013 1232   BILITOT 0.3 01/17/2013 1232   GFRNONAA >90 12/26/2013 0520   GFRAA >90 12/26/2013 0520    General: alert, cooperative, no distress and persistently anxious GI: soft, non-tender; bowel sounds normal; no masses,  no organomegaly and no guarding or rebound. Vaginal Bleeding: minimal .  Assessment: s/p Procedure(s): CERCLAGE CERVICAL (N/A): stable and rlq pain , unable to relate to cerclage at present, improving Leukocytosis , persistent Plan: Discharge home if u/s of ureteral jets normal, as expected, will d/c home Due to persistent elevated wbc, will tx Azithromycin 500 daily x 7d  followup Monday at [facility].  LOS: 2  days    Domani Bakos V 12/26/2013, 8:46 AM

## 2013-12-26 NOTE — Progress Notes (Signed)
Enyla and [name] seemed much more at ease today.  They were positive and hopeful that Taniya would be able to carry the baby for longer.  Her father, who was in [place] recovering from a stroke, was also discharged and so some of the stress has been relieved from that as well.  They were very grateful for the support.  [employer] Pager, [phone] 4:25 PM   12/26/13 1600  Clinical Encounter Type  Visited With Patient and family together  Visit Type Follow-up;Spiritual support  Spiritual Encounters  Spiritual Needs Emotional

## 2013-12-26 NOTE — Anesthesia Postprocedure Evaluation (Signed)
  Anesthesia Post-op Note  Patient: Sherry Arias  Procedure(s) Performed: Procedure(s): CERCLAGE CERVICAL (N/A)  Patient Location: PACU and Antenatal  Anesthesia Type:Spinal  Level of Consciousness: awake, alert , oriented and patient cooperative  Airway and Oxygen Therapy: Patient Spontanous Breathing  Post-op Pain: mild  Post-op Assessment: Patient's Cardiovascular Status Stable, Respiratory Function Stable, No residual numbness and No residual motor weakness, mild headache and backache  Post-op Vital Signs: Reviewed and stable  Complications: No apparent anesthesia complications

## 2013-12-26 NOTE — Discharge Summary (Signed)
Physician Discharge Summary  Patient ID: Sherry Arias MRN: [id number] DOB/AGE: 1981-01-04 33 y.o.  Admit date: 12/24/2013 Discharge date: 12/26/2013  Admission Diagnoses:ctive Problems:   Cervical incompetence affecting management of pregnancy, antepartum Pregnancy 21 weeks not delivered   Discharge Diagnoses:  Active Problems:   Cervical incompetence affecting management of pregnancy, antepartum   Discharged Condition: fair Sherry Arias is a 34 y.o. female presenting for cervical incompetence, rule out preterm labor/ infection.  History  Sherry Arias is a 32 yr G1 P0 at [redacted]w[redacted]d by initial dating and u/s who was seen for routine anatomy scan this morning, and found on u/s to have membranes protruding to the ext os thru a cervix dilated to approx 2-3 cm. NO srom or bleeding . The pt did notice an increased vaginal discharge this weekend, and has had a mild cold with congestion for the last few days.   Hospital Course: [name] was admitted for bedrest, in Trendelenburg position with Foley catheter in place to be observed overnight to rule out preterm labor. She was not placed on tocolytics or antibiotics. Overnight observation remained stable and the patient was taken the next day for rescue cerclage placement which was done under nitroglycerin uterine tocolysis, with placement of cerclage. Postoperative the patient had a greater amount of pain in the right lower quadrant that would normally be anticipated. She was observed overnight, and ultrasound performed confirmed a closed cervix with presence of a small clot behind the cerclage. The patient had persistent mild elevation of her white count and, was considered stable after an additional 24 hours postop to be discharged home. She was placed on oral azithromycin x7 days. Unfortunately after discharge the patient had membrane rupture occurred within 24 hours of her hospital discharge and therefore was readmitted for removal of cerclage and  induction of labor for pre-viable membrane rupture  Consults: None  Significant Diagnostic Studies: labs:  CBC    Component Value Date/Time   WBC 16.4* 12/27/2013 0839   RBC 3.29* 12/27/2013 0839   HGB 11.2* 12/27/2013 0839   HCT 32.8* 12/27/2013 0839   PLT 258 12/27/2013 0839   MCV 99.7 12/27/2013 0839   MCH 34.0 12/27/2013 0839   MCHC 34.1 12/27/2013 0839   RDW 12.5 12/27/2013 0839   LYMPHSABS 1.8 12/27/2013 0839   MONOABS 1.1* 12/27/2013 0839   EOSABS 0.3 12/27/2013 0839   BASOSABS 0.0 12/27/2013 0839     Treatments: surgery: Rescue McDonald cerclage 12/25/2013 JP Kalaysia Demonbreun  Discharge Exam: Blood pressure 101/57, pulse 93, temperature 98.1 F (36.7 C), temperature source Oral, resp. rate 20, height 5\' 5"  (1.651 m), weight 218 lb (98.884 kg), last menstrual period 08/08/2013, SpO2 100.00%. General appearance: alert, cooperative, appears stated age and Anxious Head: Normocephalic, without obvious abnormality, atraumatic Cardio: regular rate and rhythm GI: soft, non-tender; bowel sounds normal; no masses,  no organomegaly Pelvic: Ultrasound shows closed cervix with cervical length proximal to 1.5 cm with small 1 cm clot behind the cerclage, and due to the patient's postsurgical discomfort a renal ultrasound confirmed normal renal function without evidence of hydronephrosis  Disposition: Home on bedrest, pelvic rest, oral azithromycin for followup in 4 days [facility]  Discharge Orders   Future Appointments Provider Department Dept Phone   12/31/2013 1:30 PM Jonnie Kind, MD [facility] [phone]   Future Orders Complete By Expires   Diet - low sodium heart healthy  As directed    Increase activity slowly  As directed  Medication List         acetaminophen 500 MG tablet  Commonly known as:  TYLENOL  Take 1,000 mg by mouth daily as needed for moderate pain.     azithromycin 500 MG tablet  Commonly known as:  ZITHROMAX  Take 1 tablet (500 mg total) by  mouth daily.     DICLEGIS PO  Take 2 tablets by mouth at bedtime.     diphenhydrAMINE 25 MG tablet  Commonly known as:  BENADRYL  Take 25 mg by mouth every 6 (six) hours as needed.     DSS 100 MG Caps  Take 100 mg by mouth 2 (two) times daily.     fexofenadine 180 MG tablet  Commonly known as:  ALLEGRA  Take 180 mg by mouth daily as needed for allergies or rhinitis.     fish oil-omega-3 fatty acids 1000 MG capsule  Take 1 g by mouth daily.     HYDROcodone-acetaminophen 5-325 MG per tablet  Commonly known as:  NORCO/VICODIN  Take 1 tablet by mouth every 6 (six) hours as needed.     prenatal multivitamin Tabs tablet  Take 1 tablet by mouth daily at 12 noon.           Follow-up Information   Follow up with Jonnie Kind, MD. Schedule an appointment as soon as possible for a visit in 5 days. (or earlier, If symptoms worsen, report to [facility] hospital)    Specialties:  Obstetrics and Gynecology, Radiology   Contact information:   [place] [address] [phone]      unfortunately the patient had membrane rupture within 24 hours post discharge and resulted in removal of cerclage and delivery for previable infant  Signed: Jonnie Kind 12/26/2013, 11:10 AM

## 2013-12-26 NOTE — Progress Notes (Signed)
Discharge information given to pt and pt and spouse verbalize understanding.

## 2013-12-26 NOTE — Progress Notes (Signed)
Pt returning from u/s

## 2013-12-26 NOTE — Progress Notes (Signed)
1 Day Post-Op Procedure(s) (LRB): CERCLAGE CERVICAL (N/A)  Subjective: Patient reports back discomfort, as well as rlq discomfort., that required iv analgesics x3 last night.Currenty sleeping as per nursing report.    Objective: I have reviewed patient's vital signs and labs.  Given patient's increased discomfort , will assess pelvis by u/s this morning to confirm normal ureteral jets on u/s, and check cervical length.  Assessment: s/p Procedure(s): CERCLAGE CERVICAL (N/A): stable and slightly increased pelvic pain , will u/s this am  Plan: as above, probable outpt management today  LOS: 2 days    Shaunie Boehm V 12/26/2013, 4:47 AM

## 2013-12-27 ENCOUNTER — Encounter (HOSPITAL_COMMUNITY): Payer: Self-pay | Admitting: *Deleted

## 2013-12-27 ENCOUNTER — Inpatient Hospital Stay (HOSPITAL_COMMUNITY): Payer: BC Managed Care – PPO | Admitting: Anesthesiology

## 2013-12-27 ENCOUNTER — Encounter (HOSPITAL_COMMUNITY): Payer: BC Managed Care – PPO | Admitting: Anesthesiology

## 2013-12-27 ENCOUNTER — Inpatient Hospital Stay (HOSPITAL_COMMUNITY): Payer: BC Managed Care – PPO

## 2013-12-27 ENCOUNTER — Inpatient Hospital Stay (HOSPITAL_COMMUNITY)
Admission: AD | Admit: 2013-12-27 | Discharge: 2013-12-27 | DRG: 775 | Disposition: A | Discharge status: Home / Self Care | Payer: BC Managed Care – PPO | Source: Ambulatory Visit | Attending: Obstetrics & Gynecology | Admitting: Obstetrics & Gynecology

## 2013-12-27 DIAGNOSIS — O42919 Preterm premature rupture of membranes, unspecified as to length of time between rupture and onset of labor, unspecified trimester: Secondary | ICD-10-CM | POA: Diagnosis present

## 2013-12-27 DIAGNOSIS — O4290 Premature rupture of membranes, unspecified as to length of time between rupture and onset of labor, unspecified weeks of gestation: Principal | ICD-10-CM | POA: Diagnosis present

## 2013-12-27 DIAGNOSIS — O3430 Maternal care for cervical incompetence, unspecified trimester: Secondary | ICD-10-CM

## 2013-12-27 DIAGNOSIS — O42912 Preterm premature rupture of membranes, unspecified as to length of time between rupture and onset of labor, second trimester: Secondary | ICD-10-CM

## 2013-12-27 DIAGNOSIS — E669 Obesity, unspecified: Secondary | ICD-10-CM | POA: Diagnosis present

## 2013-12-27 DIAGNOSIS — D72829 Elevated white blood cell count, unspecified: Secondary | ICD-10-CM | POA: Diagnosis present

## 2013-12-27 DIAGNOSIS — Z87891 Personal history of nicotine dependence: Secondary | ICD-10-CM

## 2013-12-27 DIAGNOSIS — O321XX0 Maternal care for breech presentation, not applicable or unspecified: Secondary | ICD-10-CM | POA: Diagnosis present

## 2013-12-27 DIAGNOSIS — O99344 Other mental disorders complicating childbirth: Secondary | ICD-10-CM | POA: Diagnosis present

## 2013-12-27 DIAGNOSIS — F341 Dysthymic disorder: Secondary | ICD-10-CM | POA: Diagnosis present

## 2013-12-27 DIAGNOSIS — O99214 Obesity complicating childbirth: Secondary | ICD-10-CM

## 2013-12-27 LAB — CBC WITH DIFFERENTIAL/PLATELET
BASOS PCT: 0 % (ref 0–1)
Basophils Absolute: 0 10*3/uL (ref 0.0–0.1)
Eosinophils Absolute: 0.3 10*3/uL (ref 0.0–0.7)
Eosinophils Relative: 2 % (ref 0–5)
HCT: 32.8 % — ABNORMAL LOW (ref 36.0–46.0)
HEMOGLOBIN: 11.2 g/dL — AB (ref 12.0–15.0)
LYMPHS ABS: 1.8 10*3/uL (ref 0.7–4.0)
Lymphocytes Relative: 11 % — ABNORMAL LOW (ref 12–46)
MCH: 34 pg (ref 26.0–34.0)
MCHC: 34.1 g/dL (ref 30.0–36.0)
MCV: 99.7 fL (ref 78.0–100.0)
MONO ABS: 1.1 10*3/uL — AB (ref 0.1–1.0)
MONOS PCT: 7 % (ref 3–12)
NEUTROS ABS: 13.2 10*3/uL — AB (ref 1.7–7.7)
NEUTROS PCT: 80 % — AB (ref 43–77)
Platelets: 258 10*3/uL (ref 150–400)
RBC: 3.29 MIL/uL — AB (ref 3.87–5.11)
RDW: 12.5 % (ref 11.5–15.5)
WBC: 16.4 10*3/uL — ABNORMAL HIGH (ref 4.0–10.5)

## 2013-12-27 LAB — RPR: RPR Ser Ql: NONREACTIVE

## 2013-12-27 LAB — WET PREP, GENITAL
Clue Cells Wet Prep HPF POC: NONE SEEN
Trich, Wet Prep: NONE SEEN
WBC, Wet Prep HPF POC: NONE SEEN
Yeast Wet Prep HPF POC: NONE SEEN

## 2013-12-27 LAB — TYPE AND SCREEN
ABO/RH(D): A POS
ANTIBODY SCREEN: NEGATIVE

## 2013-12-27 MED ORDER — PHENYLEPHRINE 40 MCG/ML (10ML) SYRINGE FOR IV PUSH (FOR BLOOD PRESSURE SUPPORT)
80.0000 ug | PREFILLED_SYRINGE | INTRAVENOUS | Status: DC | PRN
  Filled 2013-12-27: qty 10

## 2013-12-27 MED ORDER — PRENATAL MULTIVITAMIN CH: 1.0000 | ORAL_TABLET | Freq: Every day | ORAL | Status: DC

## 2013-12-27 MED ORDER — HYDROCODONE-ACETAMINOPHEN 5-325 MG PO TABS: 1.0000 | ORAL_TABLET | Freq: Four times a day (QID) | ORAL | Status: DC | PRN

## 2013-12-27 MED ORDER — DOCUSATE SODIUM 100 MG PO CAPS: 100.0000 mg | ORAL_CAPSULE | Freq: Two times a day (BID) | ORAL | Status: DC

## 2013-12-27 MED ORDER — ACETAMINOPHEN 500 MG PO TABS: 1000.0000 mg | ORAL_TABLET | Freq: Every day | ORAL | Status: DC | PRN

## 2013-12-27 MED ORDER — PHENYLEPHRINE 40 MCG/ML (10ML) SYRINGE FOR IV PUSH (FOR BLOOD PRESSURE SUPPORT): 80.0000 ug | PREFILLED_SYRINGE | INTRAVENOUS | Status: DC | PRN

## 2013-12-27 MED ORDER — DIPHENHYDRAMINE HCL 25 MG PO TABS
25.0000 mg | ORAL_TABLET | Freq: Four times a day (QID) | ORAL | Status: DC | PRN
  Filled 2013-12-27: qty 1

## 2013-12-27 MED ORDER — MISOPROSTOL 200 MCG PO TABS
600.0000 ug | ORAL_TABLET | Freq: Once | ORAL | Status: AC
  Administered 2013-12-27: 600 ug via ORAL
  Filled 2013-12-27: qty 3

## 2013-12-27 MED ORDER — FENTANYL 2.5 MCG/ML BUPIVACAINE 1/10 % EPIDURAL INFUSION (WH - ANES)
14.0000 mL/h | INTRAMUSCULAR | Status: DC | PRN
  Filled 2013-12-27: qty 125

## 2013-12-27 MED ORDER — LORAZEPAM 2 MG/ML IJ SOLN
0.5000 mg | Freq: Four times a day (QID) | INTRAMUSCULAR | Status: DC | PRN
  Administered 2013-12-27: 1 mg via INTRAVENOUS
  Administered 2013-12-27: 0.5 mg via INTRAVENOUS
  Filled 2013-12-27 (×3): qty 1

## 2013-12-27 MED ORDER — LACTATED RINGERS IV SOLN
INTRAVENOUS | Status: DC
  Administered 2013-12-27: 09:00:00 via INTRAVENOUS

## 2013-12-27 MED ORDER — LORAZEPAM 2 MG/ML IJ SOLN: 1.0000 mg | Freq: Once | INTRAMUSCULAR | Status: DC

## 2013-12-27 MED ORDER — AZITHROMYCIN 500 MG PO TABS
500.0000 mg | ORAL_TABLET | Freq: Every day | ORAL | Status: DC
  Filled 2013-12-27 (×2): qty 1

## 2013-12-27 MED ORDER — EPHEDRINE 5 MG/ML INJ
10.0000 mg | INTRAVENOUS | Status: DC | PRN
  Filled 2013-12-27: qty 4

## 2013-12-27 MED ORDER — DIPHENHYDRAMINE HCL 50 MG/ML IJ SOLN: 12.5000 mg | INTRAMUSCULAR | Status: DC | PRN

## 2013-12-27 MED ORDER — LORATADINE 10 MG PO TABS
10.0000 mg | ORAL_TABLET | Freq: Every day | ORAL | Status: DC
  Administered 2013-12-27: 10 mg via ORAL
  Filled 2013-12-27 (×2): qty 1

## 2013-12-27 MED ORDER — CITRIC ACID-SODIUM CITRATE 334-500 MG/5ML PO SOLN: 30.0000 mL | ORAL | Status: DC | PRN

## 2013-12-27 MED ORDER — OXYTOCIN 40 UNITS IN LACTATED RINGERS INFUSION - SIMPLE MED
62.5000 mL/h | INTRAVENOUS | Status: DC
  Filled 2013-12-27: qty 1000

## 2013-12-27 MED ORDER — LACTATED RINGERS IV SOLN: 500.0000 mL | Freq: Once | INTRAVENOUS | Status: DC

## 2013-12-27 MED ORDER — EPHEDRINE 5 MG/ML INJ: 10.0000 mg | INTRAVENOUS | Status: DC | PRN

## 2013-12-27 MED ORDER — FENTANYL CITRATE 0.05 MG/ML IJ SOLN
50.0000 ug | INTRAMUSCULAR | Status: DC | PRN
  Administered 2013-12-27 (×2): 100 ug via INTRAVENOUS
  Administered 2013-12-27 (×2): 50 ug via INTRAVENOUS
  Administered 2013-12-27: 100 ug via INTRAVENOUS
  Filled 2013-12-27 (×5): qty 2

## 2013-12-27 MED ORDER — MISOPROSTOL 200 MCG PO TABS
400.0000 ug | ORAL_TABLET | ORAL | Status: DC
Start: 2013-12-27 — End: 2013-12-27

## 2013-12-27 MED ORDER — MISOPROSTOL 200 MCG PO TABS
ORAL_TABLET | ORAL | Status: AC
  Filled 2013-12-27: qty 3

## 2013-12-27 MED ORDER — ONDANSETRON HCL 4 MG/2ML IJ SOLN: 4.0000 mg | Freq: Four times a day (QID) | INTRAMUSCULAR | Status: DC | PRN

## 2013-12-27 MED ORDER — DOXYLAMINE-PYRIDOXINE 10-10 MG PO TBEC: 2.0000 | DELAYED_RELEASE_TABLET | Freq: Every day | ORAL | Status: DC

## 2013-12-27 MED ORDER — LACTATED RINGERS IV SOLN: 500.0000 mL | INTRAVENOUS | Status: DC | PRN

## 2013-12-27 MED ORDER — IBUPROFEN 600 MG PO TABS: 600.0000 mg | ORAL_TABLET | Freq: Four times a day (QID) | ORAL | Status: DC | PRN

## 2013-12-27 MED ORDER — MISOPROSTOL 200 MCG PO TABS
600.0000 ug | ORAL_TABLET | Freq: Once | ORAL | Status: AC
  Administered 2013-12-27: 600 ug via RECTAL

## 2013-12-27 MED ORDER — LIDOCAINE HCL (PF) 1 % IJ SOLN
30.0000 mL | INTRAMUSCULAR | Status: DC | PRN
  Filled 2013-12-27: qty 30

## 2013-12-27 MED ORDER — LACTATED RINGERS IV SOLN: INTRAVENOUS | Status: DC

## 2013-12-27 MED ORDER — OXYTOCIN BOLUS FROM INFUSION: 500.0000 mL | INTRAVENOUS | Status: DC

## 2013-12-27 MED ORDER — LORAZEPAM 1 MG PO TABS: 1.0000 mg | ORAL_TABLET | Freq: Four times a day (QID) | ORAL | Status: DC | PRN

## 2013-12-27 NOTE — Progress Notes (Signed)
[facility] Attending Note  Subjective:  Called to evaluate patient with increased pelvic pressure.  Admitted on 12/27/2013 for Previable Preterm Premature rupture of membranes at [redacted]w[redacted]d.   Objective:  Blood pressure 111/60, pulse 92, temperature 98.70F (37.4 C), temperature source Axillary, resp. rate 18, height 5' 5.5" (1.664 m), weight 243 lb (110.224 kg), last menstrual period 08/08/2013. Gen: NAD Lungs: CTAB Abdomen:  gravid fundus, mild tenderness diffusely Ext: 2+ DTRs, no edema, no cyanosis, negative Homan's sign Cervix: Anterior Prolene stitch recognized with sterile speculum exam Stitch  was grasped and cut; cerclage was removed successfully in one piece. Pelvic cultures done.  Small amount of bleeding noted. Cervix looked about 1 cm dilated, digital exam not done.   Assessment & Plan:  33 y.o. G1P0 at [redacted]w[redacted]d admitted for  Previable Preterm Premature rupture of membranes at [redacted]w[redacted]d.   Cerclage stitch removed, will continue expectant management for now Patient will decide later if she wants to start misoprostol; she is waiting for her friend to get here Continue close obesrvation  Verita Schneiders, MD, [place] Attending [place], [place]

## 2013-12-27 NOTE — Progress Notes (Signed)
12/27/13 1400  Clinical Encounter Type  Visited With Patient and family together (spouse [name], his parents, pt's brother, pt's friend Junie Panning)  Visit Type Follow-up;Spiritual support;Social support  Spiritual Encounters  Spiritual Needs Emotional;Grief support   Followed up with Ellard Artis, and their family this afternoon for further support.  Per pt and family, the combination of medicine and the arrival of Sacoya's dear friend Junie Panning, also a massage therapist, has helped calm and soothe Stacy.  I have introduced Spiritual Care and chaplain availability to all family members present, with support possibilities including pastoral presence, listening, bereavement support and resources, hugs, assistance with funeral home questions and/or memorial service planning, if desired.  (Per Anderson Malta, she would like her inlaws to help with funeral arrangements.)  Pt and family very appreciative.  [place] will follow closely, but please also page at any time.  Thank you.  [place], [place]

## 2013-12-27 NOTE — Progress Notes (Signed)
Chaplin notified

## 2013-12-27 NOTE — Progress Notes (Signed)
Delivery Note At 3:08pm,  a previable female fetus at [redacted]w[redacted]d was delivered in breech presentation.  Cord was clamped and cut, he was handed to his mother, father and mother's friends who are all grieving appropriately.  Delivery occurred spontaneously when patient was trying to sit up for an epidural placement, placement never occurred. Apgar scores of 2/2/2 (HR, respiratory effort) at 11/12/08 minutes.  Placenta still in place.  Will return to evaluate for placental status later and give the family some time with their son.    Osborne Oman, MD 12/27/2013, 3:14 PM

## 2013-12-27 NOTE — MAU Note (Signed)
The bed was wet when she woke up, states fluid is not still coming. No bleeding, some cramping.

## 2013-12-27 NOTE — Discharge Instructions (Signed)
°  Refer to this sheet in the next few weeks. These discharge instructions provide you with information on caring for yourself after delivery. Your caregiver may also give you specific instructions. Your treatment has been planned according to the most current medical practices available, but problems sometimes occur. Call your caregiver if you have any problems or questions after you go home. HOME CARE INSTRUCTIONS   Resume activity as directed by your caregiver.  Have someone help with home and family responsibilities during this time.   Keep track of the number of sanitary pads you use each day and how soaked (saturated) they are. Write down this information.   Do not use tampons. Do not douche or have sexual intercourse until approved by your caregiver.   Only take over-the-counter or prescription medicines for pain or discomfort as directed by your caregiver.   Do not take aspirin. Aspirin can cause bleeding.   Keep all follow-up appointments with your caregiver.   If you or your partner have problems with grieving, talk to your caregiver or seek counseling to help cope with your loss. Allow enough time to grieve before trying to get pregnant again.  SEEK IMMEDIATE MEDICAL CARE IF:   You have severe cramps or pain in your back or abdomen.  You have a fever.  You pass large blood clots (walnut-sized or larger) ortissue from your vagina. Save any tissue for your caregiver to inspect.   Your bleeding increases.   You have a thick, bad-smelling vaginal discharge.  You become lightheaded, weak, or you faint.   You have chills.  MAKE SURE YOU:  Understand these instructions.  Will watch your condition.  Will get help right away if you are not doing well or get worse. Document Released: 04/20/2001 Document Revised: 02/19/2013 Document Reviewed: 12/14/2011 [facility] Patient Information 2014 [place].

## 2013-12-27 NOTE — Progress Notes (Signed)
Faculty Practice OB/GYN Attending Note  Subjective:  Called to evaluate patient with increased pelvic pressure and pain.  Cerclage removed around 12:30pm.  Patient's friend and husband are at her side.  Upon my arrival, she is on the bed on all fours, being examined by Candis Musa, CNM who only reports feeling small fetal parts in the cervix, no parts in the vagina., exam difficulty due to patient's pain and anxiety. Patient is crying out saying she is in significant pain. She has received several doses of Fentanyl and one dose of Ativan 0.5 mg.   Admitted on 12/27/2013 for Previable Preterm Premature rupture of membranes at [redacted]w[redacted]d.   Objective:  Blood pressure 111/60, pulse 92, temperature 98.62F (37.4 C), temperature source Axillary, resp. rate 18, height 5' 5.5" (1.664 m), weight 243 lb (110.224 kg), last menstrual period 08/08/2013. Gen: NAD Cervix: As above.   Assessment & Plan:  33 y.o. G1P0 at [redacted]w[redacted]d admitted for Previable Preterm Premature rupture of membranes at [redacted]w[redacted]d, now with increased pelvic pain, small fetal parts palpated in cervix. Counseled patient about receiving an epidural for pain management, she agrees to this. Anesthesia team notified.   Will recheck patient after the epidural,  continue expectant management for now. May need to start misoprostol later. Continue close observation.   Verita Schneiders, MD, [place] Attending [place], [place]

## 2013-12-27 NOTE — Progress Notes (Signed)
Faculty Practice OB/GYN Attending Progress Note  Patient feeling increased pressure.  On exam, placenta palpated in cervix, patient had three pushes without moving the placenta significantly.  Misoprostol 600 mcg PR placed, will continue to await placental delivery. Minimal bleeding for now. Continue close observation.  Osborne Oman, MD 12/27/2013 3:31 PM

## 2013-12-27 NOTE — Progress Notes (Signed)
Provider at bedside--cerclage clipped--pt consented and states she understands and wishes to proceed with procedure--supportive family members at bedside

## 2013-12-27 NOTE — Anesthesia Postprocedure Evaluation (Signed)
  Anesthesia Post-op Note  Patient: Sherry Arias  Procedure(s) Performed: * No procedures listed *  Patient Location: Rm 161 Anesthesia Type: None  Level of Consciousness: awake, alert  and oriented  Airway and Oxygen Therapy: Patient Spontanous Breathing  No Epidural performed. Patient placed in sitting position and then felt urge to push. Fetal head at introitus. Patient placed supine for delivery of 21 week fetus.

## 2013-12-27 NOTE — Progress Notes (Signed)
Patient ID: Sherry Arias, female   DOB: [birth year], 33 y.o.   MRN: [id number] Addendum to delivery note:  Shortly after cytotec 600 mg pr, placenta delivered with maternal effort and CCT. Appears intact. FF 2/3 to U. Minimal bleeding.  Couple appropriately grieving.   Lorene Dy, CNM 12/27/2013 3:40 PM

## 2013-12-27 NOTE — H&P (Signed)
Attestation of Attending Supervision of Advanced Practitioner (PA/CNM/NP): Evaluation and management procedures were performed by the Advanced Practitioner under my supervision and collaboration.  I have reviewed the Advanced Practitioner's note and chart, and I agree with the management and plan.  I met with patient and talked to her at length about previable PPROM.  Discussed poor prognosis, inability to administer fetal resuscitation secondary to previability, increased risk of infection and bleeding.  Recommended IOL with high dose misoprostol; also emphasized that expectant management could be an option in the absence of overt infection or concerning bleeding. Unfortunately, worsening infection and bleeding are also risks of expectant management. Patient also has a cerclage in place, this will need to be removed.  Discussed risks of needing D&E in the event of heavy bleeding or retained placenta/products of conception.  All questions answered, patient is not able to make a decision right now about how to proceed.  Appropriate support given to her and her husband. Will continue to observe her, analgesia will be provided as needed.  Chaplain Services consulted to help with patient.   Verita Schneiders, MD, [place] Attending [place], [place]

## 2013-12-27 NOTE — Progress Notes (Signed)
12/27/13 1100  Clinical Encounter Type  Visited With Patient and family together (husband [name])  Visit Type Follow-up;Spiritual support;Social support (anticipated loss of pregnancy)  Spiritual Encounters  Spiritual Needs Emotional;Grief support  Stress Factors  Patient Stress Factors Loss of control;Loss;Major life changes  Family Stress Factors Loss of control;Loss;Major life changes   Sherry Arias and [name] were tearful and heartbroken on this follow-up visit.  I brought them a handmade prayer shawl as a tangible sign of support and comfort, providing pastoral presence and early grief education, following pt's lead, at bedside.  Spiritual Care will follow closely for continued support, but please page as needed/desired, 24/7:  [phone].  Thank you!  [place], [place]

## 2013-12-27 NOTE — MAU Provider Note (Signed)
Chief Complaint: Rupture of Membranes  First Provider Initiated Contact with Patient 12/27/13 0751     SUBJECTIVE HPI: Sherry Arias is a 33 y.o. G1P0 at [redacted]w[redacted]d by 11 week [place] who presents with leaking a large amount of fluid since 0630. Increased cramping since last night. S/P rescue cerclage 12/25/2013. Leukocytosis noted during that admission. D/C'd home on Azithromycin.   Past Medical History  Diagnosis Date  . Anxiety   . Depression   . Migraine   . Eczema   . Seasonal allergies   . Supervision of normal pregnancy in second trimester 11/09/2013   OB History  Gravida Para Term Preterm AB SAB TAB Ectopic Multiple Living  1             # Outcome Date GA Lbr Len/2nd Weight Sex Delivery Anes PTL Lv  1 CUR              Past Surgical History  Procedure Laterality Date  . Wisdom tooth extraction    . Cervical cerclage N/A 12/25/2013    Procedure: CERCLAGE CERVICAL;  Surgeon: Sherry Kind, MD;  Location: [facility];  Service: Gynecology;  Laterality: N/A;   History   Social History  . Marital Status: Married    Spouse Name: N/A    Number of Children: N/A  . Years of Education: N/A   Occupational History  . Not on file.   Social History Main Topics  . Smoking status: Former Smoker -- 0.50 packs/day    Quit date: 06/23/2013  . Smokeless tobacco: Never Used  . Alcohol Use: No     Comment: occ  . Drug Use: No  . Sexual Activity: Yes    Birth Control/ Protection: None   Other Topics Concern  . Not on file   Social History Narrative  . No narrative on file   No current facility-administered medications on file prior to encounter.   Current Outpatient Prescriptions on File Prior to Encounter  Medication Sig Dispense Refill  . acetaminophen (TYLENOL) 500 MG tablet Take 1,000 mg by mouth daily as needed for moderate pain.      Marland Kitchen azithromycin (ZITHROMAX) 500 MG tablet Take 1 tablet (500 mg total) by mouth daily.  7 tablet  0  . diphenhydrAMINE (BENADRYL) 25 MG tablet Take  25 mg by mouth every 6 (six) hours as needed for allergies or sleep.       Marland Kitchen docusate sodium 100 MG CAPS Take 100 mg by mouth 2 (two) times daily.  60 capsule  prn  . fexofenadine (ALLEGRA) 180 MG tablet Take 180 mg by mouth daily as needed for allergies or rhinitis.      . fish oil-omega-3 fatty acids 1000 MG capsule Take 1 g by mouth daily.        . Prenatal Vit-Fe Fumarate-FA (PRENATAL MULTIVITAMIN) TABS tablet Take 1 tablet by mouth daily at 12 noon.       Allergies  Allergen Reactions  . Aspirin Shortness Of Breath  . Ibuprofen Swelling    Skin peeling  . Codeine Rash    ROS: Neg for fever, chills, urinary complaints, GI compaints.   OBJECTIVE Blood pressure 137/72, pulse 114, temperature 97.9 F (36.6 C), resp. rate 18, height 5' 5.5" (1.664 m), weight 110.224 kg (243 lb), last menstrual period 08/08/2013. GENERAL: Well-developed, well-nourished, obese female in severe emotional distress.  HEENT: Normocephalic HEART: normal rate RESP: normal effort ABDOMEN: Soft, non-tender EXTREMITIES: Nontender, no edema NEURO: Alert and oriented SPECULUM EXAM: NEFG, moderate  amount of clear fluid mixed w/ green particulate discharge, no blood noted, cervix clean, incompletely visualized due to pt discomfort during exam--very tender.  BIMANUAL: Deferred.  FHR 150 by doppler.  LAB RESULTS Fern positive  IMAGING US Ob Detail + 14 Wk  12/24/2013    DETAILED SECOND TRIMESTER SONOGRAM  Sherry Arias is in the office for detailed second trimester sonogram.  She is a 33 y.o. year old G68P0 with Estimated Date of Delivery: 05/08/14   now at  [redacted]w[redacted]d weeks gestation. Thus far the pregnancy has been  uncomplicated.   GESTATION: SINGLETON  PRESENTATION: breech footling  FETAL ACTIVITY:          Heart rate         148 bpm          The fetus is active.  AMNIOTIC FLUID: The amniotic fluid volume is  normal,   PLACENTA LOCALIZATION:  posterior GRADE 0  CERVIX: LITTLE TO NO FUNCTIONAL CX NOTED WITH FETAL  LOWER EXTREMITIES NOTED WITHIN  CX CAVITY  ADNEXA: The ovaries are normal.   GESTATIONAL AGE AND  BIOMETRICS:  Gestational criteria: Estimated Date of Delivery: 05/08/14  now at [redacted]w[redacted]d  Previous Scans:1              BIPARIETAL DIAMETER           4.49 cm         19+4 weeks  HEAD CIRCUMFERENCE           17.66 cm         20+1 weeks  ABDOMINAL CIRCUMFERENCE           14.74 cm         20+0 weeks  FEMUR LENGTH           3.53 cm         21+1 weeks                                                           AVERAGE EGA(BY THIS SCAN):   20+2 weeks                                                 ESTIMATED FETAL WEIGHT:        362  grams,    ANATOMICAL SURVEY                                                                             COMMENTS CEREBRAL VENTRICLES yes normal   CHOROID PLEXUS yes normal   CEREBELLUM yes normal   CISTERNA MAGNA yes normal   NUCHAL REGION yes normal   ORBITS yes normal   NASAL BONE yes normal   NOSE/LIP yes normal   FACIAL PROFILE yes normal   4 CHAMBERED HEART yes normal   OUTFLOW TRACTS yes normal   DIAPHRAGM yes normal   STOMACH yes normal  RENAL REGION yes normal   BLADDER yes normal   CORD INSERTION yes normal   3 VESSEL CORD yes normal   SPINE yes normal   ARMS/HANDS yes normal   LEGS/FEET yes normal   GENITALIA yes normal         SUSPECTED ABNORMALITIES:  yes  QUALITY OF SCAN: satisfactory   TECHNICIAN COMMENTS:   U/S(19+5wks)-active breech fetus, meas c/w dates, fluid wnl, posterior Gr  0 placenta, bilateral adnexa wnl, no major abnl noted although sub-optimal  view of cardiac OFT's, CX-little to no functional cx noted with fetal  lower extremities noted within on vaginal exam   A copy of this report including all images has been saved and backed up to  a second source for retrieval if needed. All measures and details of the  anatomical scan, placentation, fluid volume and pelvic anatomy are  contained in that report.  Sherry Arias 12/24/2013 11:15 AM           US Ob  Transvaginal  12/24/2013    DETAILED SECOND TRIMESTER SONOGRAM  Sherry Arias is in the office for detailed second trimester sonogram.  She is a 33 y.o. year old G1P0 with Estimated Date of Delivery: 05/08/14   now at  [redacted]w[redacted]d weeks gestation. Thus far the pregnancy has been  uncomplicated.   GESTATION: SINGLETON  PRESENTATION: breech footling  FETAL ACTIVITY:          Heart rate         148 bpm          The fetus is active.  AMNIOTIC FLUID: The amniotic fluid volume is  normal,   PLACENTA LOCALIZATION:  posterior GRADE 0  CERVIX: LITTLE TO NO FUNCTIONAL CX NOTED WITH FETAL LOWER EXTREMITIES NOTED WITHIN  CX CAVITY  ADNEXA: The ovaries are normal.   GESTATIONAL AGE AND  BIOMETRICS:  Gestational criteria: Estimated Date of Delivery: 05/08/14  now at [redacted]w[redacted]d  Previous Scans:1              BIPARIETAL DIAMETER           4.49 cm         19+4 weeks  HEAD CIRCUMFERENCE           17.66 cm         20+1 weeks  ABDOMINAL CIRCUMFERENCE           14.74 cm         20+0 weeks  FEMUR LENGTH           3.53 cm         21+1 weeks                                                           AVERAGE EGA(BY THIS SCAN):   20+2 weeks                                                 ESTIMATED FETAL WEIGHT:        362  grams,    ANATOMICAL SURVEY  COMMENTS CEREBRAL VENTRICLES yes normal   CHOROID PLEXUS yes normal   CEREBELLUM yes normal   CISTERNA MAGNA yes normal   NUCHAL REGION yes normal   ORBITS yes normal   NASAL BONE yes normal   NOSE/LIP yes normal   FACIAL PROFILE yes normal   4 CHAMBERED HEART yes normal   OUTFLOW TRACTS yes normal   DIAPHRAGM yes normal   STOMACH yes normal   RENAL REGION yes normal   BLADDER yes normal   CORD INSERTION yes normal   3 VESSEL CORD yes normal   SPINE yes normal   ARMS/HANDS yes normal   LEGS/FEET yes normal   GENITALIA yes normal         SUSPECTED ABNORMALITIES:  yes  QUALITY OF SCAN: satisfactory   TECHNICIAN COMMENTS:    U/S(19+5wks)-active breech fetus, meas c/w dates, fluid wnl, posterior Gr  0 placenta, bilateral adnexa wnl, no major abnl noted although sub-optimal  view of cardiac OFT's, CX-little to no functional cx noted with fetal  lower extremities noted within on vaginal exam   A copy of this report including all images has been saved and backed up to  a second source for retrieval if needed. All measures and details of the  anatomical scan, placentation, fluid volume and pelvic anatomy are  contained in that report.  Sherry Arias 12/24/2013 11:15 AM           US Renal  12/26/2013   CLINICAL DATA:  History of kidney stones. Cervical cerclage. Evaluate for ureteral jets. Back pain.  EXAM: RENAL/URINARY TRACT ULTRASOUND COMPLETE  COMPARISON:  None.  FINDINGS: Right Kidney:  Length: 12.9 cm. Echogenicity within normal limits. No mass or hydronephrosis visualized.  Left Kidney:  Length: 12.5 cm. Echogenicity within normal limits. No mass or hydronephrosis visualized.  Bladder:  Appears normal for degree of bladder distention. Bilateral ureteral jets noted.  IMPRESSION: Normal exam.   Electronically Signed   By: Marcello Moores  Register   On: 12/26/2013 09:52    MAU COURSE CBC w/ dif, OB limited to assess cervix, [place]-berg, Fentanyl for pain. Dr. Harolyn Rutherford consulted. Will come discuss results when back.    ASSESSMENT 1. Cervical incompetence affecting management of pregnancy, antepartum   2. Premature rupture of membranes in second trimester    PLAN Report given to Dr. Harolyn Rutherford.  Will determine POC.  Offered Chaplain. Declined.  [place], CNM 12/27/2013  8:21 AM

## 2013-12-27 NOTE — MAU Provider Note (Signed)
Attestation of Attending Supervision of Advanced Practitioner (PA/CNM/NP): Evaluation and management procedures were performed by the Advanced Practitioner under my supervision and collaboration.  I have reviewed the Advanced Practitioner's note and chart, and I agree with the management and plan.  Adreyan Carbajal, MD, FACOG Attending Obstetrician & Gynecologist Faculty Practice, [facility]  

## 2013-12-27 NOTE — Progress Notes (Signed)
12/27/13 1700  Clinical Encounter Type  Visited With Patient and family together  Visit Type Follow-up;Spiritual support;Social support (delivery at [employer])  Spiritual Encounters  Spiritual Needs Grief support;Emotional;Brochure (reviewed Heartstrings, Comfort resources with extended famil)  Stress Factors  Patient Stress Factors Loss  Family Stress Factors Loss   Provided pastoral presence and emotional support after baby [name] was born.  While Sherry Arias and Sherry Arias were spending time with baby, I visited with his parents, her brother, and her friend Sherry Arias to offer further support, including bereavement care and education, as well as community support resources.  (Note:  I did NOT review Comfort/Heartstrings info with Sherry Arias and Sherry Arias; please share with them at an appropriate time.)  Named and honored the dual grief of losing a grandbaby and seeing one's child(ren) so heartbroken.  Affirmed the importance of self-care and the sensitive energy it takes to support others through their loss.  Family welcomed support resources and chaplain as conversation partner and spiritual companion.  Please page as needs arise:  [phone].  Thank you.  [place], [place]

## 2013-12-27 NOTE — H&P (Signed)
Sherry Arias is a 33 y.o. female G1P0 at [redacted]w[redacted]d by 41 week [place] who presents with leaking a large amount of fluid since 0630. Increased cramping since last night. S/P rescue cerclage 12/25/2013. Leukocytosis noted during that admission. D/C'd home on Azithromycin.   History OB History   Grav Para Term Preterm Abortions TAB SAB Ect Mult Living   1              Past Medical History  Diagnosis Date  . Anxiety   . Depression   . Migraine   . Eczema   . Seasonal allergies   . Supervision of normal pregnancy in second trimester 11/09/2013   Past Surgical History  Procedure Laterality Date  . Wisdom tooth extraction    . Cervical cerclage N/A 12/25/2013    Procedure: CERCLAGE CERVICAL;  Surgeon: Jonnie Kind, MD;  Location: [facility];  Service: Gynecology;  Laterality: N/A;   Family History: family history includes Cervical cancer in her mother; Diabetes in her father; Heart disease in her father and paternal grandfather; Hypertension in her father; Mental illness in her mother; Ovarian cancer in her mother. Social History:  reports that she quit smoking about 6 months ago. She has never used smokeless tobacco. She reports that she does not drink alcohol or use illicit drugs.   Prenatal Transfer Tool  Maternal Diabetes: No Genetic Screening: Normal Maternal Ultrasounds/Referrals: Abnormal:  Findings:   Other:  Incompetent cervix  Fetal Ultrasounds or other Referrals:  Referred to Materal Fetal Medicine  Maternal Substance Abuse:  No Significant Maternal Medications:  Meds include: Other:  Azithro Significant Maternal Lab Results:  Lab values include: Other:  WBC 16.4 Other Comments:  None  Review of Systems  Constitutional: Negative for fever and chills.  Eyes: Negative for blurred vision.  Respiratory: Negative for shortness of breath.   Cardiovascular: Negative for chest pain and leg swelling.  Gastrointestinal: Negative for abdominal pain.  Neurological: Positive for headaches.      Blood pressure 137/72, pulse 114, temperature 97.9 F (36.6 C), resp. rate 18, height 5' 5.5" (1.664 m), weight 110.224 kg (243 lb), last menstrual period 08/08/2013.  Maternal Exam:  Abdomen: Patient reports no abdominal tenderness. Fetal presentation: breech     Physical Exam  Prenatal labs: ABO, Rh: --/--/A POS (02/16 1909) Antibody: NEG (02/16 1909) Rubella: 1.00 (01/02 0954) RPR: NON REAC (01/02 0954)  HBsAg: NEGATIVE (01/02 0954)  HIV: NON REACTIVE (01/02 0954)   Assessment/Plan: Sherry Arias is a 33 y.o. G1P0 at [redacted]w[redacted]d by 11 week [place] who presents with Premature rupture of membranes in second trimester s/p rescue cerclage 12/25/2013 - Cerclage in place - WBC remains elevated w/ mild tachycardia (possible due to stress vs infection); Afeb - Will watch for fevers & low threshold for induction  - Continue Azithro x 7 days (End date 2/24)   [name] 12/27/2013, 9:18 AM  Evaluation and management procedures were performed by Resident physician under my supervision/collaboration. Chart reviewed, patient examined by me and I agree with management and plan.  Results for orders placed during the hospital encounter of 12/27/13 (from the past 24 hour(s))  CBC WITH DIFFERENTIAL     Status: Abnormal   Collection Time    12/27/13  8:39 AM      Result Value Ref Range   WBC 16.4 (*) 4.0 - 10.5 K/uL   RBC 3.29 (*) 3.87 - 5.11 MIL/uL   Hemoglobin 11.2 (*) 12.0 - 15.0 g/dL   HCT 32.8 (*) 36.0 -  46.0 %   MCV 99.7  78.0 - 100.0 fL   MCH 34.0  26.0 - 34.0 pg   MCHC 34.1  30.0 - 36.0 g/dL   RDW 12.5  11.5 - 15.5 %   Platelets 258  150 - 400 K/uL   Neutrophils Relative % 80 (*) 43 - 77 %   Neutro Abs 13.2 (*) 1.7 - 7.7 K/uL   Lymphocytes Relative 11 (*) 12 - 46 %   Lymphs Abs 1.8  0.7 - 4.0 K/uL   Monocytes Relative 7  3 - 12 %   Monocytes Absolute 1.1 (*) 0.1 - 1.0 K/uL   Eosinophils Relative 2  0 - 5 %   Eosinophils Absolute 0.3  0.0 - 0.7 K/uL   Basophils Relative 0  0  - 1 %   Basophils Absolute 0.0  0.0 - 0.1 K/uL  Previable breech fetus well-dated at [redacted]w[redacted]d w/o overt intrauterine infection but significant leukocytosis> Dr. Harolyn Rutherford to see and counsel pt and offer IOL. Lorene Dy, CNM 12/27/2013 10:06 AM

## 2013-12-27 NOTE — Discharge Summary (Addendum)
Obstetric Discharge Summary Reason for Admission: Previable preterm premature rupture of membranes at [redacted]w[redacted]d. Prenatal Procedures: none Intrapartum Procedures: spontaneous vaginal delivery Postpartum Procedures: none Complications-Operative and Postpartum: none Hemoglobin  Date Value Ref Range Status  12/27/2013 11.2* 12.0 - 15.0 g/dL Final     HCT  Date Value Ref Range Status  12/27/2013 32.8* 36.0 - 46.0 % Final   Patient was admitted on 12/27/13 with Previable Preterm Premature rupture of membranes at [redacted]w[redacted]d, after undergoing a rescue cerclage on 12/25/13 for cervical incompetence. She opted for expectant management and was appropriately sad due to the prognosis.  SVD occurred, see note below.  Delivery Note  At 3:08 pm, a previable female infant at [redacted]w[redacted]d was delivered in complete breech presentation. Cord was clamped and cut, he was handed to his mother (patient), father and mother's friend who are all grieving appropriately.  Delivery occurred spontaneously when patient was trying to sit up for an epidural placement, placement never occurred. Apgar scores were 2/2/2 (one for HR, one for respiratory effort) at 11/12/08 minutes; weight 354 g.  Misoprostol 600 mcg was placed per rectum, and the placenta delivered spontaneously at 3:37pm and was noted to be intact on examination.  EBL was 200 ml.  No complications during delivery. Infant lived for 1 hour and 22 minutes, he was held by his parents for that entire period.  Appropriate support was given to the patient and her family members. Chaplain services were called and helped with giving support.  Patient was discharged to home after three hours of observation, she was stable and had minimal bleeding.   Physical Exam:  BP 95/76  Pulse 96  Temp(Src) 99.4 F (37.4 C) (Axillary)  Resp 18  Ht 5' 5.5" (1.664 m)  Wt 243 lb (110.224 kg)  BMI 39.81 kg/m2  LMP 08/08/2013  Breastfeeding? Unknown General: appropriately sad Lochia:  appropriate Uterine Fundus: firm, below umbilicus DVT Evaluation: No evidence of DVT seen on physical exam.  Discharge Diagnoses: Previable preterm premature rupture of membranes at [redacted]w[redacted]d -  delivered  Discharge Information: Date: 12/27/2013 Activity: pelvic rest for 4 weeks Diet: routine Medications: Vicodin and Ativan Condition: stable Instructions: refer to AVS discharge instructions Discharge to: home Follow up:  [facility], patient will call to make appointment   Osborne Oman, MD 12/27/2013, 6:09 PM

## 2013-12-27 NOTE — Anesthesia Preprocedure Evaluation (Addendum)
Anesthesia Evaluation  Patient identified by MRN, date of birth, ID band Patient awake    Reviewed: Allergy & Precautions, H&P , NPO status , Patient's Chart, lab work & pertinent test results  Airway Mallampati: III TM Distance: >3 FB Neck ROM: Full    Dental no notable dental hx. (+) Teeth Intact   Pulmonary former smoker,  breath sounds clear to auscultation  Pulmonary exam normal       Cardiovascular negative cardio ROS  Rhythm:Regular Rate:Normal     Neuro/Psych  Headaches, PSYCHIATRIC DISORDERS Anxiety Depression    GI/Hepatic negative GI ROS, Neg liver ROS,   Endo/Other  negative endocrine ROSMorbid obesity  Renal/GU negative Renal ROS  negative genitourinary   Musculoskeletal negative musculoskeletal ROS (+)   Abdominal (+) + obese,   Peds  Hematology negative hematology ROS (+)   Anesthesia Other Findings   Reproductive/Obstetrics PPROM 21 weeks                          Anesthesia Physical Anesthesia Plan  ASA: III  Anesthesia Plan: Epidural   Post-op Pain Management:    Induction:   Airway Management Planned: Natural Airway  Additional Equipment:   Intra-op Plan:   Post-operative Plan:   Informed Consent: I have reviewed the patients History and Physical, chart, labs and discussed the procedure including the risks, benefits and alternatives for the proposed anesthesia with the patient or authorized representative who has indicated his/her understanding and acceptance.     Plan Discussed with: Anesthesiologist  Anesthesia Plan Comments: (Patient delivered prior to epidural placement)       Anesthesia Quick Evaluation

## 2013-12-28 LAB — GC/CHLAMYDIA PROBE AMP
CT PROBE, AMP APTIMA: NEGATIVE
GC Probe RNA: NEGATIVE

## 2013-12-31 ENCOUNTER — Encounter: Payer: BC Managed Care – PPO | Admitting: Obstetrics and Gynecology

## 2014-01-01 ENCOUNTER — Encounter (HOSPITAL_COMMUNITY): Payer: Self-pay | Admitting: *Deleted

## 2014-01-14 ENCOUNTER — Ambulatory Visit (INDEPENDENT_AMBULATORY_CARE_PROVIDER_SITE_OTHER): Payer: BC Managed Care – PPO | Admitting: Obstetrics and Gynecology

## 2014-01-14 ENCOUNTER — Encounter: Payer: Self-pay | Admitting: Obstetrics and Gynecology

## 2014-01-14 VITALS — BP 122/84 | Ht 66.0 in | Wt 238.2 lb

## 2014-01-14 DIAGNOSIS — F53 Postpartum depression: Secondary | ICD-10-CM

## 2014-01-14 DIAGNOSIS — O99345 Other mental disorders complicating the puerperium: Secondary | ICD-10-CM

## 2014-01-14 MED ORDER — NORGESTIMATE-ETH ESTRADIOL 0.25-35 MG-MCG PO TABS: 1.0000 | ORAL_TABLET | Freq: Every day | ORAL | Status: DC

## 2014-01-14 NOTE — Progress Notes (Signed)
  This chart was scribed by Ludger Nutting, Medical Scribe, for Dr. Mallory Shirk on 01/14/14 at 10:40 AM. This chart was reviewed by Dr. Mallory Shirk and is accurate.   Subjective:  Sherry Arias is a 33 y.o. female who presents for a 2.5 weeks postpartum visit  Patient concerns: reports decreased vaginal discharge over the past few days. She denies having a fever.  Prenatal and intrapartum course notable for premature delivery at [redacted]w[redacted]d after cerclage was removed. Baby was spontaneously delivered in breech position.  Pt states she returned to work last week. She reports seeing a grief counselor with her significant other.   Patient is not sexually active.   The following portions of the patient's history were reviewed and updated as appropriate: allergies, current medications, past family history, past medical history, past surgical history and problem list.  Review of Systems    See Subjective, otherwise negative ROS.  Objective:  BP 122/84  Ht 5\' 6"  (1.676 m)  Wt 238 lb 3.2 oz (108.047 kg)  BMI 38.46 kg/m2  General:  alert, cooperative and no distress     Lungs: clear to auscultation bilaterally  Heart:  regular rate and rhythm, S1, S2 normal, no murmur  Abdomen: soft, non-tender; bowel sounds normal; no masses,  no organomegaly   Vulva:  normal  Vagina: normal vagina diffuse hypersensitivity to exam, sidewall perceived as tender, equal to bladder or cervix contact  Cervix:  Normal small amount of membrane remnant extracted with forceps, lochia persists  Corpus: normal size, contour, position, consistency, mobility, non-tender  Adnexa:  normal adnexa          Assessment:  1.  postpartum exam.  2. Contraception: sprintec  3.  Post partum depression, on zoloft  Plan:  1. Start on sprintec for birth control

## 2014-01-14 NOTE — Patient Instructions (Addendum)
Ethinyl Estradiol; Norgestimate tablets What is this medicine? ETHINYL ESTRADIOL; NORGESTIMATE (ETH in il es tra DYE ole; nor JES ti mate) is an oral contraceptive. The products combine two types of female hormones, an estrogen and a progestin. They are used to prevent ovulation and pregnancy. Some products are also used to treat acne in females. This medicine may be used for other purposes; ask your health care provider or pharmacist if you have questions. COMMON BRAND NAME(S): Estarylla, MONO-LINYAH, MonoNessa, Ortho Tri-Cyclen Lo, Ortho Tri-Cyclen, Ortho-Cyclen, Previfem , Sprintec, Tri-Estarylla, TRI-LINYAH, Tri-Lo-Sprintec , Tri-Previfem , Tri-Sprintec , Bertram Millard What should I tell my health care provider before I take this medicine? They need to know if you have or ever had any of these conditions: -abnormal vaginal bleeding -blood vessel disease or blood clots -breast, cervical, endometrial, ovarian, liver, or uterine cancer -diabetes -gallbladder disease -heart disease or recent heart attack -high blood pressure -high cholesterol -kidney disease -liver disease -migraine headaches -stroke -systemic lupus erythematosus (SLE) -tobacco smoker -an unusual or allergic reaction to estrogens, progestins, other medicines, foods, dyes, or preservatives -pregnant or trying to get pregnant -breast-feeding How should I use this medicine? Take this medicine by mouth. To reduce nausea, this medicine may be taken with food. Follow the directions on the prescription label. Take this medicine at the same time each day and in the order directed on the package. Do not take your medicine more often than directed. Contact your pediatrician regarding the use of this medicine in children. Special care may be needed. This medicine has been used in female children who have started having menstrual periods. A patient package insert for the product will be given with each prescription and refill. Read this  sheet carefully each time. The sheet may change frequently. Overdosage: If you think you have taken too much of this medicine contact a poison control center or emergency room at once. NOTE: This medicine is only for you. Do not share this medicine with others. What if I miss a dose? If you miss a dose, refer to the patient information sheet you received with your medicine for direction. If you miss more than one pill, this medicine may not be as effective and you may need to use another form of birth control. What may interact with this medicine? -acetaminophen -antibiotics or medicines for infections, especially rifampin, rifabutin, rifapentine, and griseofulvin, and possibly penicillins or tetracyclines -aprepitant -ascorbic acid (vitamin C) -atorvastatin -barbiturate medicines, such as phenobarbital -bosentan -carbamazepine -caffeine -clofibrate -cyclosporine -dantrolene -doxercalciferol -felbamate -grapefruit juice -hydrocortisone -medicines for anxiety or sleeping problems, such as diazepam or temazepam -medicines for diabetes, including pioglitazone -mineral oil -modafinil -mycophenolate -nefazodone -oxcarbazepine -phenytoin -prednisolone -ritonavir or other medicines for HIV infection or AIDS -rosuvastatin -selegiline -soy isoflavones supplements -St. Lucillie Kiesel's wort -tamoxifen or raloxifene -theophylline -thyroid hormones -topiramate -warfarin This list may not describe all possible interactions. Give your health care provider a list of all the medicines, herbs, non-prescription drugs, or dietary supplements you use. Also tell them if you smoke, drink alcohol, or use illegal drugs. Some items may interact with your medicine. What should I watch for while using this medicine? Visit your doctor or health care professional for regular checks on your progress. You will need a regular breast and pelvic exam and Pap smear while on this medicine. You should also discuss the  need for regular mammograms with your health care professional, and follow his or her guidelines for these tests. This medicine can make your body retain fluid, making your  fingers, hands, or ankles swell. Your blood pressure can go up. Contact your doctor or health care professional if you feel you are retaining fluid. Use an additional method of contraception during the first cycle that you take these tablets. If you have any reason to think you are pregnant, stop taking this medicine right away and contact your doctor or health care professional. If you are taking this medicine for hormone related problems, it may take several cycles of use to see improvement in your condition. Smoking increases the risk of getting a blood clot or having a stroke while you are taking birth control pills, especially if you are more than 34 years old. You are strongly advised not to smoke. This medicine can make you more sensitive to the sun. Keep out of the sun. If you cannot avoid being in the sun, wear protective clothing and use sunscreen. Do not use sun lamps or tanning beds/booths. If you wear contact lenses and notice visual changes, or if the lenses begin to feel uncomfortable, consult your eye care specialist. In some women, tenderness, swelling, or minor bleeding of the gums may occur. Notify your dentist if this happens. Brushing and flossing your teeth regularly may help limit this. See your dentist regularly and inform your dentist of the medicines you are taking. If you are going to have elective surgery, you may need to stop taking this medicine before the surgery. Consult your health care professional for advice. This medicine does not protect you against HIV infection (AIDS) or any other sexually transmitted diseases. What side effects may I notice from receiving this medicine? Side effects that you should report to your doctor or health care professional as soon as possible: -breast tissue changes or  discharge -changes in vaginal bleeding during your period or between your periods -chest pain -coughing up blood -dizziness or fainting spells -headaches or migraines -leg, arm or groin pain -severe or sudden headaches -stomach pain (severe) -sudden shortness of breath -sudden loss of coordination, especially on one side of the body -speech problems -symptoms of vaginal infection like itching, irritation or unusual discharge -tenderness in the upper abdomen -vomiting -weakness or numbness in the arms or legs, especially on one side of the body -yellowing of the eyes or skin Side effects that usually do not require medical attention (report to your doctor or health care professional if they continue or are bothersome): -breakthrough bleeding and spotting that continues beyond the 3 initial cycles of pills -breast tenderness -mood changes, anxiety, depression, frustration, anger, or emotional outbursts -increased sensitivity to sun or ultraviolet light -nausea -skin rash, acne, or brown spots on the skin -weight gain (slight) This list may not describe all possible side effects. Call your doctor for medical advice about side effects. You may report side effects to FDA at 1-800-FDA-1088. Where should I keep my medicine? Keep out of the reach of children. Store at room temperature between 15 and 30 degrees C (59 and 86 degrees F). Throw away any unused medicine after the expiration date. NOTE: This sheet is a summary. It may not cover all possible information. If you have questions about this medicine, talk to your doctor, pharmacist, or health care provider.  2014, Elsevier/Gold Standard. (2008-10-10 13:40:47)

## 2014-01-21 ENCOUNTER — Telehealth: Payer: Self-pay | Admitting: Adult Health

## 2014-01-21 NOTE — Telephone Encounter (Signed)
Pt encouraged to push fluids and try tylenol per Derrek Monaco, NP, if no improvement or symptoms worsen pt to call our office back. Pt states "does feel better today than yesterday."

## 2014-02-11 ENCOUNTER — Encounter: Payer: Self-pay | Admitting: Obstetrics and Gynecology

## 2014-02-11 ENCOUNTER — Ambulatory Visit (INDEPENDENT_AMBULATORY_CARE_PROVIDER_SITE_OTHER): Payer: BC Managed Care – PPO | Admitting: Obstetrics and Gynecology

## 2014-02-11 ENCOUNTER — Encounter (INDEPENDENT_AMBULATORY_CARE_PROVIDER_SITE_OTHER): Payer: Self-pay

## 2014-02-11 ENCOUNTER — Ambulatory Visit: Payer: BC Managed Care – PPO | Admitting: Obstetrics and Gynecology

## 2014-02-11 VITALS — BP 110/74 | Ht 66.0 in | Wt 239.0 lb

## 2014-02-11 DIAGNOSIS — Z8742 Personal history of other diseases of the female genital tract: Secondary | ICD-10-CM

## 2014-02-11 DIAGNOSIS — Z3009 Encounter for other general counseling and advice on contraception: Secondary | ICD-10-CM

## 2014-02-11 DIAGNOSIS — Z8759 Personal history of other complications of pregnancy, childbirth and the puerperium: Secondary | ICD-10-CM

## 2014-02-11 DIAGNOSIS — Z7189 Other specified counseling: Secondary | ICD-10-CM

## 2014-02-11 NOTE — Progress Notes (Signed)
SUBJECTIVE: Benna presents to follow up for future pregnancy planning after a [redacted]w[redacted]d loss due to PPROM and cervical incompetence following a rescue cerclage.  The week after she last saw Dr. Glo Herring, had heavy bleeding with clots. Was advised to take ibuprofen (which she's allergic to) and take zoloft, which she wasn't taking. Had bleeding again starting on the 28th of March. 28th large clot about golf ball sized. Was in bed for 2 days with large clots. The bleeding ended a few days ago.  Has another physician (Dr. Casimer Leek) who is prescribing her wellbutrin and a sleeping medication. Has hx of generalized anxiety disorder for several years which was previously well managed, but became severe during her delivery and thereafter. Seeing a grief counselor in [facility] [place]) and speaks with a support person through [facility] weekly. She works as a Transport planner.  OBJECTIVE: BP 110/74  Ht 5\' 6"  (1.676 m)  Wt 108.41 kg (239 lb)  BMI 38.59 kg/m2  LMP 02/02/2014  Gen: NAD HEENT: NCAT Resp: NWOB GU: deferred Psych: sad affect, quiet voice, well groomed, normal eye contact   ASSESSMENT/PLAN:  -Discussed need for preventive cerclage at 12-14 weeks during future. pregnancies. -Continue seeing grief counselor. -Advised that physically she would be safe to attempt to get pregnant again 3 months after her delivery.  Scribed for Dr. Glo Herring by Chrisandra Netters, MD (Family Medicine PGY-2)

## 2014-09-09 ENCOUNTER — Encounter: Payer: Self-pay | Admitting: Obstetrics and Gynecology

## 2016-01-21 ENCOUNTER — Other Ambulatory Visit: Payer: Self-pay | Admitting: Obstetrics & Gynecology

## 2016-02-23 DIAGNOSIS — Z6831 Body mass index (BMI) 31.0-31.9, adult: Secondary | ICD-10-CM | POA: Diagnosis not present

## 2016-02-23 DIAGNOSIS — E669 Obesity, unspecified: Secondary | ICD-10-CM | POA: Diagnosis not present

## 2016-03-03 DIAGNOSIS — F339 Major depressive disorder, recurrent, unspecified: Secondary | ICD-10-CM | POA: Diagnosis not present

## 2016-03-22 DIAGNOSIS — Z130 Encounter for screening for diseases of the blood and blood-forming organs and certain disorders involving the immune mechanism: Secondary | ICD-10-CM | POA: Diagnosis not present

## 2016-03-22 DIAGNOSIS — Z31430 Encounter of female for testing for genetic disease carrier status for procreative management: Secondary | ICD-10-CM | POA: Diagnosis not present

## 2016-03-22 DIAGNOSIS — Z13228 Encounter for screening for other metabolic disorders: Secondary | ICD-10-CM | POA: Diagnosis not present

## 2016-03-22 DIAGNOSIS — Z319 Encounter for procreative management, unspecified: Secondary | ICD-10-CM | POA: Diagnosis not present

## 2016-03-22 DIAGNOSIS — N883 Incompetence of cervix uteri: Secondary | ICD-10-CM | POA: Diagnosis not present

## 2016-03-22 DIAGNOSIS — E288 Other ovarian dysfunction: Secondary | ICD-10-CM | POA: Diagnosis not present

## 2016-03-26 DIAGNOSIS — Z319 Encounter for procreative management, unspecified: Secondary | ICD-10-CM | POA: Diagnosis not present

## 2016-04-07 DIAGNOSIS — Z3141 Encounter for fertility testing: Secondary | ICD-10-CM | POA: Diagnosis not present

## 2016-04-26 DIAGNOSIS — E782 Mixed hyperlipidemia: Secondary | ICD-10-CM | POA: Diagnosis not present

## 2016-05-03 DIAGNOSIS — F339 Major depressive disorder, recurrent, unspecified: Secondary | ICD-10-CM | POA: Diagnosis not present

## 2016-05-03 DIAGNOSIS — F411 Generalized anxiety disorder: Secondary | ICD-10-CM | POA: Diagnosis not present

## 2016-05-03 DIAGNOSIS — J45998 Other asthma: Secondary | ICD-10-CM | POA: Diagnosis not present

## 2016-05-05 DIAGNOSIS — N390 Urinary tract infection, site not specified: Secondary | ICD-10-CM | POA: Diagnosis not present

## 2016-06-07 DIAGNOSIS — N3020 Other chronic cystitis without hematuria: Secondary | ICD-10-CM | POA: Diagnosis not present

## 2016-06-07 DIAGNOSIS — Z87442 Personal history of urinary calculi: Secondary | ICD-10-CM | POA: Diagnosis not present

## 2016-08-02 DIAGNOSIS — F339 Major depressive disorder, recurrent, unspecified: Secondary | ICD-10-CM | POA: Diagnosis not present

## 2016-08-02 DIAGNOSIS — F5101 Primary insomnia: Secondary | ICD-10-CM | POA: Diagnosis not present

## 2016-08-02 DIAGNOSIS — F411 Generalized anxiety disorder: Secondary | ICD-10-CM | POA: Diagnosis not present

## 2016-10-18 DIAGNOSIS — F5101 Primary insomnia: Secondary | ICD-10-CM | POA: Diagnosis not present

## 2016-10-18 DIAGNOSIS — F411 Generalized anxiety disorder: Secondary | ICD-10-CM | POA: Diagnosis not present

## 2016-10-18 DIAGNOSIS — F339 Major depressive disorder, recurrent, unspecified: Secondary | ICD-10-CM | POA: Diagnosis not present

## 2016-10-18 DIAGNOSIS — G43839 Menstrual migraine, intractable, without status migrainosus: Secondary | ICD-10-CM | POA: Diagnosis not present

## 2016-11-22 DIAGNOSIS — E288 Other ovarian dysfunction: Secondary | ICD-10-CM | POA: Diagnosis not present

## 2016-11-22 DIAGNOSIS — N926 Irregular menstruation, unspecified: Secondary | ICD-10-CM | POA: Diagnosis not present

## 2016-12-16 DIAGNOSIS — Z319 Encounter for procreative management, unspecified: Secondary | ICD-10-CM | POA: Diagnosis not present

## 2016-12-31 DIAGNOSIS — N3090 Cystitis, unspecified without hematuria: Secondary | ICD-10-CM | POA: Diagnosis not present

## 2016-12-31 DIAGNOSIS — Z6833 Body mass index (BMI) 33.0-33.9, adult: Secondary | ICD-10-CM | POA: Diagnosis not present

## 2017-01-13 DIAGNOSIS — Z319 Encounter for procreative management, unspecified: Secondary | ICD-10-CM | POA: Diagnosis not present

## 2017-01-20 DIAGNOSIS — E282 Polycystic ovarian syndrome: Secondary | ICD-10-CM | POA: Diagnosis not present

## 2017-01-20 DIAGNOSIS — Z319 Encounter for procreative management, unspecified: Secondary | ICD-10-CM | POA: Diagnosis not present

## 2017-02-03 DIAGNOSIS — Z3141 Encounter for fertility testing: Secondary | ICD-10-CM | POA: Diagnosis not present

## 2017-02-14 DIAGNOSIS — F5101 Primary insomnia: Secondary | ICD-10-CM | POA: Diagnosis not present

## 2017-02-14 DIAGNOSIS — F411 Generalized anxiety disorder: Secondary | ICD-10-CM | POA: Diagnosis not present

## 2017-02-14 DIAGNOSIS — F339 Major depressive disorder, recurrent, unspecified: Secondary | ICD-10-CM | POA: Diagnosis not present

## 2017-02-14 DIAGNOSIS — Z3169 Encounter for other general counseling and advice on procreation: Secondary | ICD-10-CM | POA: Diagnosis not present

## 2017-04-11 DIAGNOSIS — Z0000 Encounter for general adult medical examination without abnormal findings: Secondary | ICD-10-CM | POA: Diagnosis not present

## 2017-04-18 DIAGNOSIS — F411 Generalized anxiety disorder: Secondary | ICD-10-CM | POA: Diagnosis not present

## 2017-04-18 DIAGNOSIS — J4520 Mild intermittent asthma, uncomplicated: Secondary | ICD-10-CM | POA: Diagnosis not present

## 2017-04-18 DIAGNOSIS — Z0000 Encounter for general adult medical examination without abnormal findings: Secondary | ICD-10-CM | POA: Diagnosis not present

## 2017-04-18 DIAGNOSIS — F339 Major depressive disorder, recurrent, unspecified: Secondary | ICD-10-CM | POA: Diagnosis not present

## 2017-04-19 DIAGNOSIS — Z3200 Encounter for pregnancy test, result unknown: Secondary | ICD-10-CM | POA: Diagnosis not present

## 2017-05-03 DIAGNOSIS — Z319 Encounter for procreative management, unspecified: Secondary | ICD-10-CM | POA: Diagnosis not present

## 2017-05-16 DIAGNOSIS — O3430 Maternal care for cervical incompetence, unspecified trimester: Secondary | ICD-10-CM | POA: Diagnosis not present

## 2017-05-16 DIAGNOSIS — O0900 Supervision of pregnancy with history of infertility, unspecified trimester: Secondary | ICD-10-CM | POA: Diagnosis not present

## 2017-05-24 DIAGNOSIS — R51 Headache: Secondary | ICD-10-CM | POA: Diagnosis not present

## 2017-05-24 DIAGNOSIS — O3430 Maternal care for cervical incompetence, unspecified trimester: Secondary | ICD-10-CM | POA: Diagnosis not present

## 2017-05-24 DIAGNOSIS — Z3689 Encounter for other specified antenatal screening: Secondary | ICD-10-CM | POA: Diagnosis not present

## 2017-05-24 DIAGNOSIS — F419 Anxiety disorder, unspecified: Secondary | ICD-10-CM | POA: Diagnosis not present

## 2017-05-24 DIAGNOSIS — O09521 Supervision of elderly multigravida, first trimester: Secondary | ICD-10-CM | POA: Diagnosis not present

## 2017-05-24 LAB — OB RESULTS CONSOLE GC/CHLAMYDIA
CHLAMYDIA, DNA PROBE: NEGATIVE
Gonorrhea: NEGATIVE

## 2017-05-24 LAB — OB RESULTS CONSOLE HIV ANTIBODY (ROUTINE TESTING): HIV: NONREACTIVE

## 2017-05-24 LAB — OB RESULTS CONSOLE ABO/RH: RH Type: POSITIVE

## 2017-05-24 LAB — OB RESULTS CONSOLE RPR: RPR: NONREACTIVE

## 2017-05-24 LAB — OB RESULTS CONSOLE RUBELLA ANTIBODY, IGM: Rubella: IMMUNE

## 2017-05-24 LAB — OB RESULTS CONSOLE ANTIBODY SCREEN: Antibody Screen: NEGATIVE

## 2017-05-24 LAB — OB RESULTS CONSOLE HEPATITIS B SURFACE ANTIGEN: HEP B S AG: NEGATIVE

## 2017-05-31 DIAGNOSIS — O3430 Maternal care for cervical incompetence, unspecified trimester: Secondary | ICD-10-CM | POA: Diagnosis not present

## 2017-05-31 DIAGNOSIS — O3431 Maternal care for cervical incompetence, first trimester: Secondary | ICD-10-CM | POA: Diagnosis not present

## 2017-05-31 DIAGNOSIS — O09521 Supervision of elderly multigravida, first trimester: Secondary | ICD-10-CM | POA: Diagnosis not present

## 2017-05-31 DIAGNOSIS — Z3A10 10 weeks gestation of pregnancy: Secondary | ICD-10-CM | POA: Diagnosis not present

## 2017-06-08 DIAGNOSIS — Z8759 Personal history of other complications of pregnancy, childbirth and the puerperium: Secondary | ICD-10-CM | POA: Diagnosis not present

## 2017-06-08 DIAGNOSIS — O3431 Maternal care for cervical incompetence, first trimester: Secondary | ICD-10-CM | POA: Diagnosis not present

## 2017-06-08 DIAGNOSIS — O3430 Maternal care for cervical incompetence, unspecified trimester: Secondary | ICD-10-CM | POA: Diagnosis not present

## 2017-06-08 DIAGNOSIS — O26879 Cervical shortening, unspecified trimester: Secondary | ICD-10-CM | POA: Diagnosis not present

## 2017-06-08 DIAGNOSIS — Z3A12 12 weeks gestation of pregnancy: Secondary | ICD-10-CM | POA: Diagnosis not present

## 2017-06-08 DIAGNOSIS — N96 Recurrent pregnancy loss: Secondary | ICD-10-CM | POA: Diagnosis not present

## 2017-06-27 DIAGNOSIS — O09521 Supervision of elderly multigravida, first trimester: Secondary | ICD-10-CM | POA: Diagnosis not present

## 2017-06-27 DIAGNOSIS — Z3A13 13 weeks gestation of pregnancy: Secondary | ICD-10-CM | POA: Diagnosis not present

## 2017-06-27 DIAGNOSIS — O3431 Maternal care for cervical incompetence, first trimester: Secondary | ICD-10-CM | POA: Diagnosis not present

## 2017-07-14 DIAGNOSIS — O09522 Supervision of elderly multigravida, second trimester: Secondary | ICD-10-CM | POA: Diagnosis not present

## 2017-07-14 DIAGNOSIS — Z3A16 16 weeks gestation of pregnancy: Secondary | ICD-10-CM | POA: Diagnosis not present

## 2017-07-14 DIAGNOSIS — Z3686 Encounter for antenatal screening for cervical length: Secondary | ICD-10-CM | POA: Diagnosis not present

## 2017-07-14 DIAGNOSIS — Z361 Encounter for antenatal screening for raised alphafetoprotein level: Secondary | ICD-10-CM | POA: Diagnosis not present

## 2017-07-19 DIAGNOSIS — O26893 Other specified pregnancy related conditions, third trimester: Secondary | ICD-10-CM | POA: Diagnosis not present

## 2017-07-19 DIAGNOSIS — O9989 Other specified diseases and conditions complicating pregnancy, childbirth and the puerperium: Secondary | ICD-10-CM | POA: Diagnosis not present

## 2017-07-19 DIAGNOSIS — Z3A17 17 weeks gestation of pregnancy: Secondary | ICD-10-CM | POA: Diagnosis not present

## 2017-07-19 DIAGNOSIS — M549 Dorsalgia, unspecified: Secondary | ICD-10-CM | POA: Diagnosis not present

## 2017-07-19 DIAGNOSIS — R1084 Generalized abdominal pain: Secondary | ICD-10-CM | POA: Diagnosis not present

## 2017-08-02 DIAGNOSIS — Z3A19 19 weeks gestation of pregnancy: Secondary | ICD-10-CM | POA: Diagnosis not present

## 2017-08-02 DIAGNOSIS — O3432 Maternal care for cervical incompetence, second trimester: Secondary | ICD-10-CM | POA: Diagnosis not present

## 2017-08-09 DIAGNOSIS — O3432 Maternal care for cervical incompetence, second trimester: Secondary | ICD-10-CM | POA: Diagnosis not present

## 2017-08-09 DIAGNOSIS — Z23 Encounter for immunization: Secondary | ICD-10-CM | POA: Diagnosis not present

## 2017-08-09 DIAGNOSIS — Z3A20 20 weeks gestation of pregnancy: Secondary | ICD-10-CM | POA: Diagnosis not present

## 2017-08-12 DIAGNOSIS — O26892 Other specified pregnancy related conditions, second trimester: Secondary | ICD-10-CM | POA: Diagnosis not present

## 2017-08-12 DIAGNOSIS — N898 Other specified noninflammatory disorders of vagina: Secondary | ICD-10-CM | POA: Diagnosis not present

## 2017-08-12 DIAGNOSIS — O09522 Supervision of elderly multigravida, second trimester: Secondary | ICD-10-CM | POA: Diagnosis not present

## 2017-08-12 DIAGNOSIS — Z3A20 20 weeks gestation of pregnancy: Secondary | ICD-10-CM | POA: Diagnosis not present

## 2017-08-12 DIAGNOSIS — O3432 Maternal care for cervical incompetence, second trimester: Secondary | ICD-10-CM | POA: Diagnosis not present

## 2017-08-12 DIAGNOSIS — O3430 Maternal care for cervical incompetence, unspecified trimester: Secondary | ICD-10-CM | POA: Diagnosis not present

## 2017-08-17 DIAGNOSIS — O26892 Other specified pregnancy related conditions, second trimester: Secondary | ICD-10-CM | POA: Diagnosis not present

## 2017-08-17 DIAGNOSIS — Z3A21 21 weeks gestation of pregnancy: Secondary | ICD-10-CM | POA: Diagnosis not present

## 2017-08-22 DIAGNOSIS — O3432 Maternal care for cervical incompetence, second trimester: Secondary | ICD-10-CM | POA: Diagnosis not present

## 2017-08-22 DIAGNOSIS — Z3A21 21 weeks gestation of pregnancy: Secondary | ICD-10-CM | POA: Diagnosis not present

## 2017-08-29 DIAGNOSIS — O3432 Maternal care for cervical incompetence, second trimester: Secondary | ICD-10-CM | POA: Diagnosis not present

## 2017-08-29 DIAGNOSIS — Z3A22 22 weeks gestation of pregnancy: Secondary | ICD-10-CM | POA: Diagnosis not present

## 2017-09-06 DIAGNOSIS — O3432 Maternal care for cervical incompetence, second trimester: Secondary | ICD-10-CM | POA: Diagnosis not present

## 2017-09-06 DIAGNOSIS — Z3A24 24 weeks gestation of pregnancy: Secondary | ICD-10-CM | POA: Diagnosis not present

## 2017-09-12 DIAGNOSIS — O3432 Maternal care for cervical incompetence, second trimester: Secondary | ICD-10-CM | POA: Diagnosis not present

## 2017-09-12 DIAGNOSIS — Z3A24 24 weeks gestation of pregnancy: Secondary | ICD-10-CM | POA: Diagnosis not present

## 2017-09-20 DIAGNOSIS — O3432 Maternal care for cervical incompetence, second trimester: Secondary | ICD-10-CM | POA: Diagnosis not present

## 2017-09-20 DIAGNOSIS — Z3A26 26 weeks gestation of pregnancy: Secondary | ICD-10-CM | POA: Diagnosis not present

## 2017-09-26 DIAGNOSIS — Z3689 Encounter for other specified antenatal screening: Secondary | ICD-10-CM | POA: Diagnosis not present

## 2017-09-26 DIAGNOSIS — O3432 Maternal care for cervical incompetence, second trimester: Secondary | ICD-10-CM | POA: Diagnosis not present

## 2017-09-26 DIAGNOSIS — Z3A26 26 weeks gestation of pregnancy: Secondary | ICD-10-CM | POA: Diagnosis not present

## 2017-10-03 DIAGNOSIS — Z3A27 27 weeks gestation of pregnancy: Secondary | ICD-10-CM | POA: Diagnosis not present

## 2017-10-03 DIAGNOSIS — O3432 Maternal care for cervical incompetence, second trimester: Secondary | ICD-10-CM | POA: Diagnosis not present

## 2017-10-10 DIAGNOSIS — O3432 Maternal care for cervical incompetence, second trimester: Secondary | ICD-10-CM | POA: Diagnosis not present

## 2017-10-10 DIAGNOSIS — Z3A28 28 weeks gestation of pregnancy: Secondary | ICD-10-CM | POA: Diagnosis not present

## 2017-10-10 DIAGNOSIS — O3433 Maternal care for cervical incompetence, third trimester: Secondary | ICD-10-CM | POA: Diagnosis not present

## 2017-10-18 DIAGNOSIS — Z3A30 30 weeks gestation of pregnancy: Secondary | ICD-10-CM | POA: Diagnosis not present

## 2017-10-18 DIAGNOSIS — Z3689 Encounter for other specified antenatal screening: Secondary | ICD-10-CM | POA: Diagnosis not present

## 2017-10-18 DIAGNOSIS — O3433 Maternal care for cervical incompetence, third trimester: Secondary | ICD-10-CM | POA: Diagnosis not present

## 2017-10-24 DIAGNOSIS — Z3A30 30 weeks gestation of pregnancy: Secondary | ICD-10-CM | POA: Diagnosis not present

## 2017-10-24 DIAGNOSIS — O3433 Maternal care for cervical incompetence, third trimester: Secondary | ICD-10-CM | POA: Diagnosis not present

## 2017-11-02 DIAGNOSIS — O3433 Maternal care for cervical incompetence, third trimester: Secondary | ICD-10-CM | POA: Diagnosis not present

## 2017-11-02 DIAGNOSIS — Z3A32 32 weeks gestation of pregnancy: Secondary | ICD-10-CM | POA: Diagnosis not present

## 2017-11-07 DIAGNOSIS — O3433 Maternal care for cervical incompetence, third trimester: Secondary | ICD-10-CM | POA: Diagnosis not present

## 2017-11-07 DIAGNOSIS — Z3A32 32 weeks gestation of pregnancy: Secondary | ICD-10-CM | POA: Diagnosis not present

## 2017-11-14 DIAGNOSIS — O3433 Maternal care for cervical incompetence, third trimester: Secondary | ICD-10-CM | POA: Diagnosis not present

## 2017-11-14 DIAGNOSIS — Z3A33 33 weeks gestation of pregnancy: Secondary | ICD-10-CM | POA: Diagnosis not present

## 2017-11-21 DIAGNOSIS — Z3685 Encounter for antenatal screening for Streptococcus B: Secondary | ICD-10-CM | POA: Diagnosis not present

## 2017-11-21 DIAGNOSIS — Z3A34 34 weeks gestation of pregnancy: Secondary | ICD-10-CM | POA: Diagnosis not present

## 2017-11-21 DIAGNOSIS — Z23 Encounter for immunization: Secondary | ICD-10-CM | POA: Diagnosis not present

## 2017-11-21 DIAGNOSIS — O3433 Maternal care for cervical incompetence, third trimester: Secondary | ICD-10-CM | POA: Diagnosis not present

## 2017-11-28 DIAGNOSIS — Z3A35 35 weeks gestation of pregnancy: Secondary | ICD-10-CM | POA: Diagnosis not present

## 2017-11-28 DIAGNOSIS — O3663X0 Maternal care for excessive fetal growth, third trimester, not applicable or unspecified: Secondary | ICD-10-CM | POA: Diagnosis not present

## 2017-11-29 ENCOUNTER — Encounter (HOSPITAL_COMMUNITY): Payer: Self-pay | Admitting: *Deleted

## 2017-11-30 ENCOUNTER — Other Ambulatory Visit: Payer: Self-pay | Admitting: Obstetrics and Gynecology

## 2017-12-05 DIAGNOSIS — Z3A36 36 weeks gestation of pregnancy: Secondary | ICD-10-CM | POA: Diagnosis not present

## 2017-12-05 DIAGNOSIS — O3433 Maternal care for cervical incompetence, third trimester: Secondary | ICD-10-CM | POA: Diagnosis not present

## 2017-12-12 DIAGNOSIS — O133 Gestational [pregnancy-induced] hypertension without significant proteinuria, third trimester: Secondary | ICD-10-CM | POA: Diagnosis not present

## 2017-12-12 DIAGNOSIS — Z3A39 39 weeks gestation of pregnancy: Secondary | ICD-10-CM | POA: Diagnosis not present

## 2017-12-13 ENCOUNTER — Encounter (HOSPITAL_COMMUNITY)
Admission: RE | Admit: 2017-12-13 | Discharge: 2017-12-13 | Disposition: A | Discharge status: Home / Self Care | Payer: BLUE CROSS/BLUE SHIELD | Source: Ambulatory Visit | Attending: Obstetrics and Gynecology | Admitting: Obstetrics and Gynecology

## 2017-12-13 DIAGNOSIS — O9081 Anemia of the puerperium: Secondary | ICD-10-CM | POA: Diagnosis not present

## 2017-12-13 DIAGNOSIS — O99344 Other mental disorders complicating childbirth: Secondary | ICD-10-CM | POA: Diagnosis not present

## 2017-12-13 DIAGNOSIS — O134 Gestational [pregnancy-induced] hypertension without significant proteinuria, complicating childbirth: Secondary | ICD-10-CM | POA: Diagnosis not present

## 2017-12-13 DIAGNOSIS — Z23 Encounter for immunization: Secondary | ICD-10-CM | POA: Diagnosis not present

## 2017-12-13 DIAGNOSIS — F329 Major depressive disorder, single episode, unspecified: Secondary | ICD-10-CM | POA: Diagnosis not present

## 2017-12-13 DIAGNOSIS — L259 Unspecified contact dermatitis, unspecified cause: Secondary | ICD-10-CM | POA: Diagnosis not present

## 2017-12-13 DIAGNOSIS — D62 Acute posthemorrhagic anemia: Secondary | ICD-10-CM | POA: Diagnosis not present

## 2017-12-13 DIAGNOSIS — Z2882 Immunization not carried out because of caregiver refusal: Secondary | ICD-10-CM | POA: Diagnosis not present

## 2017-12-13 DIAGNOSIS — Z87891 Personal history of nicotine dependence: Secondary | ICD-10-CM | POA: Diagnosis not present

## 2017-12-13 DIAGNOSIS — F419 Anxiety disorder, unspecified: Secondary | ICD-10-CM | POA: Diagnosis not present

## 2017-12-13 DIAGNOSIS — O99214 Obesity complicating childbirth: Secondary | ICD-10-CM | POA: Diagnosis not present

## 2017-12-13 DIAGNOSIS — O3433 Maternal care for cervical incompetence, third trimester: Secondary | ICD-10-CM | POA: Diagnosis not present

## 2017-12-13 DIAGNOSIS — O164 Unspecified maternal hypertension, complicating childbirth: Secondary | ICD-10-CM | POA: Diagnosis not present

## 2017-12-13 DIAGNOSIS — Z3A38 38 weeks gestation of pregnancy: Secondary | ICD-10-CM | POA: Diagnosis not present

## 2017-12-13 HISTORY — DX: Premenstrual dysphoric disorder: F32.81

## 2017-12-13 HISTORY — DX: Nausea with vomiting, unspecified: Z98.890

## 2017-12-13 HISTORY — DX: Nausea with vomiting, unspecified: R11.2

## 2017-12-13 HISTORY — DX: Unspecified asthma, uncomplicated: J45.909

## 2017-12-13 LAB — CBC
HCT: 34.3 % — ABNORMAL LOW (ref 36.0–46.0)
HEMOGLOBIN: 11.6 g/dL — AB (ref 12.0–15.0)
MCH: 32.6 pg (ref 26.0–34.0)
MCHC: 33.8 g/dL (ref 30.0–36.0)
MCV: 96.3 fL (ref 78.0–100.0)
Platelets: 284 10*3/uL (ref 150–400)
RBC: 3.56 MIL/uL — ABNORMAL LOW (ref 3.87–5.11)
RDW: 12.9 % (ref 11.5–15.5)
WBC: 19.3 10*3/uL — ABNORMAL HIGH (ref 4.0–10.5)

## 2017-12-13 LAB — TYPE AND SCREEN
ABO/RH(D): A POS
ANTIBODY SCREEN: NEGATIVE

## 2017-12-13 NOTE — Patient Instructions (Signed)
Katyra Tomassetti  12/13/2017   Your procedure is scheduled on:  12/14/2017  Enter through the Main Entrance of [facility] at [place] up the phone at the desk and dial [phone]  Call this number if you have problems the morning of surgery:[phone]  Remember:   Do not eat food:(After Midnight) Desps de medianoche.  Do not drink clear liquids: (6 Hours before arrival) 6 horas ante llegada.  Take these medicines the morning of surgery with A SIP OF WATER: take wellbutrin, buspar effexor and nexium.  Bring your inhaler with you.   Do not wear jewelry, make-up or nail polish.  Do not wear lotions, powders, or perfumes. Do not wear deodorant.  Do not shave 48 hours prior to surgery.  Do not bring valuables to the hospital.  [facility] is not   responsible for any belongings or valuables brought to the hospital.  Contacts, dentures or bridgework may not be worn into surgery.  Leave suitcase in the car. After surgery it may be brought to your room.  For patients admitted to the hospital, checkout time is 11:00 AM the day of              discharge.    N/A   Please read over the following fact sheets that you were given:   Surgical Site Infection Prevention

## 2017-12-14 ENCOUNTER — Inpatient Hospital Stay (HOSPITAL_COMMUNITY)
Admission: AD | Admit: 2017-12-14 | Discharge: 2017-12-17 | DRG: 787 | Disposition: A | Discharge status: Home / Self Care | Payer: BLUE CROSS/BLUE SHIELD | Source: Ambulatory Visit | Attending: Obstetrics and Gynecology | Admitting: Obstetrics and Gynecology

## 2017-12-14 ENCOUNTER — Inpatient Hospital Stay (HOSPITAL_COMMUNITY): Payer: BLUE CROSS/BLUE SHIELD | Admitting: Certified Registered"

## 2017-12-14 ENCOUNTER — Encounter (HOSPITAL_COMMUNITY): Payer: Self-pay | Admitting: *Deleted

## 2017-12-14 ENCOUNTER — Encounter (HOSPITAL_COMMUNITY)
Admission: AD | Disposition: A | Discharge status: Home / Self Care | Payer: Self-pay | Source: Ambulatory Visit | Attending: Obstetrics and Gynecology

## 2017-12-14 DIAGNOSIS — F329 Major depressive disorder, single episode, unspecified: Secondary | ICD-10-CM | POA: Diagnosis present

## 2017-12-14 DIAGNOSIS — Z23 Encounter for immunization: Secondary | ICD-10-CM | POA: Diagnosis not present

## 2017-12-14 DIAGNOSIS — O3433 Maternal care for cervical incompetence, third trimester: Secondary | ICD-10-CM | POA: Diagnosis not present

## 2017-12-14 DIAGNOSIS — O134 Gestational [pregnancy-induced] hypertension without significant proteinuria, complicating childbirth: Secondary | ICD-10-CM | POA: Diagnosis present

## 2017-12-14 DIAGNOSIS — L259 Unspecified contact dermatitis, unspecified cause: Secondary | ICD-10-CM | POA: Diagnosis present

## 2017-12-14 DIAGNOSIS — O99214 Obesity complicating childbirth: Secondary | ICD-10-CM | POA: Diagnosis present

## 2017-12-14 DIAGNOSIS — O3430 Maternal care for cervical incompetence, unspecified trimester: Secondary | ICD-10-CM | POA: Diagnosis present

## 2017-12-14 DIAGNOSIS — O9902 Anemia complicating childbirth: Secondary | ICD-10-CM | POA: Diagnosis present

## 2017-12-14 DIAGNOSIS — F419 Anxiety disorder, unspecified: Secondary | ICD-10-CM | POA: Diagnosis present

## 2017-12-14 DIAGNOSIS — O99344 Other mental disorders complicating childbirth: Secondary | ICD-10-CM | POA: Diagnosis present

## 2017-12-14 DIAGNOSIS — Z87891 Personal history of nicotine dependence: Secondary | ICD-10-CM

## 2017-12-14 DIAGNOSIS — Z3A38 38 weeks gestation of pregnancy: Secondary | ICD-10-CM | POA: Diagnosis not present

## 2017-12-14 DIAGNOSIS — O164 Unspecified maternal hypertension, complicating childbirth: Secondary | ICD-10-CM | POA: Diagnosis not present

## 2017-12-14 DIAGNOSIS — O9081 Anemia of the puerperium: Secondary | ICD-10-CM | POA: Diagnosis not present

## 2017-12-14 DIAGNOSIS — D62 Acute posthemorrhagic anemia: Secondary | ICD-10-CM | POA: Diagnosis present

## 2017-12-14 LAB — RPR: RPR: NONREACTIVE

## 2017-12-14 SURGERY — Surgical Case
Anesthesia: Spinal

## 2017-12-14 MED ORDER — FENTANYL CITRATE (PF) 100 MCG/2ML IJ SOLN
INTRAMUSCULAR | Status: AC
  Filled 2017-12-14: qty 2

## 2017-12-14 MED ORDER — SCOPOLAMINE 1 MG/3DAYS TD PT72
MEDICATED_PATCH | TRANSDERMAL | Status: DC | PRN
  Administered 2017-12-14: 1 via TRANSDERMAL

## 2017-12-14 MED ORDER — SOD CITRATE-CITRIC ACID 500-334 MG/5ML PO SOLN
30.0000 mL | Freq: Once | ORAL | Status: AC
  Administered 2017-12-14: 30 mL via ORAL

## 2017-12-14 MED ORDER — PNEUMOCOCCAL VAC POLYVALENT 25 MCG/0.5ML IJ INJ
0.5000 mL | INJECTION | INTRAMUSCULAR | Status: AC
  Administered 2017-12-15: 0.5 mL via INTRAMUSCULAR
  Filled 2017-12-14: qty 0.5

## 2017-12-14 MED ORDER — NALBUPHINE HCL 10 MG/ML IJ SOLN: 5.0000 mg | Freq: Once | INTRAMUSCULAR | Status: DC | PRN

## 2017-12-14 MED ORDER — CEFAZOLIN SODIUM-DEXTROSE 2-4 GM/100ML-% IV SOLN
2.0000 g | INTRAVENOUS | Status: AC
  Administered 2017-12-14: 2 g via INTRAVENOUS
  Filled 2017-12-14: qty 100

## 2017-12-14 MED ORDER — MORPHINE SULFATE (PF) 0.5 MG/ML IJ SOLN
INTRAMUSCULAR | Status: AC
  Filled 2017-12-14: qty 10

## 2017-12-14 MED ORDER — DEXAMETHASONE SODIUM PHOSPHATE 10 MG/ML IJ SOLN
INTRAMUSCULAR | Status: AC
  Filled 2017-12-14: qty 1

## 2017-12-14 MED ORDER — SIMETHICONE 80 MG PO CHEW
80.0000 mg | CHEWABLE_TABLET | ORAL | Status: DC | PRN
Start: 2017-12-14 — End: 2017-12-17

## 2017-12-14 MED ORDER — SCOPOLAMINE 1 MG/3DAYS TD PT72: 1.0000 | MEDICATED_PATCH | Freq: Once | TRANSDERMAL | Status: DC

## 2017-12-14 MED ORDER — PANTOPRAZOLE SODIUM 20 MG PO TBEC
20.0000 mg | DELAYED_RELEASE_TABLET | Freq: Once | ORAL | Status: AC
  Administered 2017-12-14: 20 mg via ORAL
  Filled 2017-12-14: qty 1

## 2017-12-14 MED ORDER — MORPHINE SULFATE (PF) 0.5 MG/ML IJ SOLN
INTRAMUSCULAR | Status: DC | PRN
  Administered 2017-12-14: .2 mg via INTRATHECAL

## 2017-12-14 MED ORDER — FENTANYL CITRATE (PF) 100 MCG/2ML IJ SOLN
INTRAMUSCULAR | Status: DC | PRN
  Administered 2017-12-14: 50 ug via INTRAVENOUS
  Administered 2017-12-14: 20 ug via INTRATHECAL
  Administered 2017-12-14: 30 ug via INTRAVENOUS

## 2017-12-14 MED ORDER — HYDROMORPHONE HCL 1 MG/ML IJ SOLN: 0.2500 mg | INTRAMUSCULAR | Status: DC | PRN

## 2017-12-14 MED ORDER — NALOXONE HCL 0.4 MG/ML IJ SOLN: 0.4000 mg | INTRAMUSCULAR | Status: DC | PRN

## 2017-12-14 MED ORDER — ACETAMINOPHEN 500 MG PO TABS
1000.0000 mg | ORAL_TABLET | Freq: Four times a day (QID) | ORAL | Status: AC
  Administered 2017-12-14: 1000 mg via ORAL
  Filled 2017-12-14: qty 2

## 2017-12-14 MED ORDER — BUPROPION HCL ER (XL) 300 MG PO TB24
300.0000 mg | ORAL_TABLET | Freq: Every day | ORAL | Status: DC
  Administered 2017-12-15 – 2017-12-17 (×3): 300 mg via ORAL
  Filled 2017-12-14 (×5): qty 1

## 2017-12-14 MED ORDER — BUPIVACAINE HCL (PF) 0.25 % IJ SOLN
INTRAMUSCULAR | Status: DC | PRN
  Administered 2017-12-14: 20 mL

## 2017-12-14 MED ORDER — SOD CITRATE-CITRIC ACID 500-334 MG/5ML PO SOLN
ORAL | Status: AC
  Filled 2017-12-14: qty 15

## 2017-12-14 MED ORDER — WITCH HAZEL-GLYCERIN EX PADS: 1.0000 "application " | MEDICATED_PAD | CUTANEOUS | Status: DC | PRN

## 2017-12-14 MED ORDER — SCOPOLAMINE 1 MG/3DAYS TD PT72
MEDICATED_PATCH | TRANSDERMAL | Status: AC
  Filled 2017-12-14: qty 1

## 2017-12-14 MED ORDER — OXYCODONE-ACETAMINOPHEN 5-325 MG PO TABS
2.0000 | ORAL_TABLET | ORAL | Status: DC | PRN
  Administered 2017-12-15 – 2017-12-17 (×9): 2 via ORAL
  Filled 2017-12-14 (×9): qty 2

## 2017-12-14 MED ORDER — PROMETHAZINE HCL 25 MG/ML IJ SOLN: 6.2500 mg | INTRAMUSCULAR | Status: DC | PRN

## 2017-12-14 MED ORDER — OXYCODONE-ACETAMINOPHEN 5-325 MG PO TABS
1.0000 | ORAL_TABLET | ORAL | Status: DC | PRN
  Administered 2017-12-15 – 2017-12-17 (×3): 1 via ORAL
  Filled 2017-12-14 (×3): qty 1

## 2017-12-14 MED ORDER — DIPHENHYDRAMINE HCL 25 MG PO CAPS: 25.0000 mg | ORAL_CAPSULE | Freq: Four times a day (QID) | ORAL | Status: DC | PRN

## 2017-12-14 MED ORDER — PRENATAL MULTIVITAMIN CH
1.0000 | ORAL_TABLET | Freq: Every day | ORAL | Status: DC
  Administered 2017-12-15 – 2017-12-16 (×2): 1 via ORAL
  Filled 2017-12-14 (×2): qty 1

## 2017-12-14 MED ORDER — COCONUT OIL OIL: 1.0000 "application " | TOPICAL_OIL | Status: DC | PRN

## 2017-12-14 MED ORDER — LACTATED RINGERS IV SOLN
INTRAVENOUS | Status: DC | PRN
Start: 2017-12-14 — End: 2017-12-14
  Administered 2017-12-14: 13:00:00 via INTRAVENOUS

## 2017-12-14 MED ORDER — ZOLPIDEM TARTRATE 5 MG PO TABS: 5.0000 mg | ORAL_TABLET | Freq: Every evening | ORAL | Status: DC | PRN

## 2017-12-14 MED ORDER — SIMETHICONE 80 MG PO CHEW
80.0000 mg | CHEWABLE_TABLET | ORAL | Status: DC
  Administered 2017-12-14 – 2017-12-16 (×3): 80 mg via ORAL
  Filled 2017-12-14 (×3): qty 1

## 2017-12-14 MED ORDER — MEPERIDINE HCL 25 MG/ML IJ SOLN: 6.2500 mg | INTRAMUSCULAR | Status: DC | PRN

## 2017-12-14 MED ORDER — SODIUM CHLORIDE 0.9% FLUSH: 3.0000 mL | INTRAVENOUS | Status: DC | PRN

## 2017-12-14 MED ORDER — ONDANSETRON HCL 4 MG/2ML IJ SOLN: 4.0000 mg | Freq: Three times a day (TID) | INTRAMUSCULAR | Status: DC | PRN

## 2017-12-14 MED ORDER — PHENYLEPHRINE 8 MG IN D5W 100 ML (0.08MG/ML) PREMIX OPTIME
INJECTION | INTRAVENOUS | Status: DC | PRN
  Administered 2017-12-14: 60 ug/min via INTRAVENOUS

## 2017-12-14 MED ORDER — DEXAMETHASONE SODIUM PHOSPHATE 10 MG/ML IJ SOLN
INTRAMUSCULAR | Status: DC | PRN
  Administered 2017-12-14: 10 mg via INTRAVENOUS

## 2017-12-14 MED ORDER — SODIUM CHLORIDE 0.9 % IR SOLN
Status: DC | PRN
  Administered 2017-12-14: 1000 mL

## 2017-12-14 MED ORDER — MENTHOL 3 MG MT LOZG: 1.0000 | LOZENGE | OROMUCOSAL | Status: DC | PRN

## 2017-12-14 MED ORDER — ACETAMINOPHEN 10 MG/ML IV SOLN: 1000.0000 mg | Freq: Once | INTRAVENOUS | Status: DC | PRN

## 2017-12-14 MED ORDER — OXYTOCIN 10 UNIT/ML IJ SOLN
INTRAMUSCULAR | Status: AC
Start: 2017-12-14 — End: 2017-12-14
  Filled 2017-12-14: qty 3

## 2017-12-14 MED ORDER — MORPHINE SULFATE (PF) 4 MG/ML IV SOLN
2.0000 mg | Freq: Once | INTRAVENOUS | Status: AC
  Administered 2017-12-14: 2 mg via INTRAVENOUS
  Filled 2017-12-14: qty 1

## 2017-12-14 MED ORDER — METHYLERGONOVINE MALEATE 0.2 MG/ML IJ SOLN: 0.2000 mg | INTRAMUSCULAR | Status: DC | PRN

## 2017-12-14 MED ORDER — ACETAMINOPHEN 325 MG PO TABS: 650.0000 mg | ORAL_TABLET | ORAL | Status: DC | PRN

## 2017-12-14 MED ORDER — NALBUPHINE HCL 10 MG/ML IJ SOLN: 5.0000 mg | INTRAMUSCULAR | Status: DC | PRN

## 2017-12-14 MED ORDER — BUPIVACAINE IN DEXTROSE 0.75-8.25 % IT SOLN
INTRATHECAL | Status: DC | PRN
  Administered 2017-12-14: 1.6 mL via INTRATHECAL

## 2017-12-14 MED ORDER — SENNOSIDES-DOCUSATE SODIUM 8.6-50 MG PO TABS
2.0000 | ORAL_TABLET | ORAL | Status: DC
  Administered 2017-12-14 – 2017-12-16 (×3): 2 via ORAL
  Filled 2017-12-14 (×3): qty 2

## 2017-12-14 MED ORDER — LACTATED RINGERS IV SOLN
INTRAVENOUS | Status: DC
  Administered 2017-12-14 (×3): via INTRAVENOUS

## 2017-12-14 MED ORDER — ONDANSETRON HCL 4 MG/2ML IJ SOLN
INTRAMUSCULAR | Status: AC
  Filled 2017-12-14: qty 2

## 2017-12-14 MED ORDER — DIBUCAINE 1 % RE OINT: 1.0000 "application " | TOPICAL_OINTMENT | RECTAL | Status: DC | PRN

## 2017-12-14 MED ORDER — OXYTOCIN 10 UNIT/ML IJ SOLN
INTRAMUSCULAR | Status: DC | PRN
  Administered 2017-12-14: 40 [IU] via INTRAVENOUS

## 2017-12-14 MED ORDER — OXYTOCIN 40 UNITS IN LACTATED RINGERS INFUSION - SIMPLE MED: 2.5000 [IU]/h | INTRAVENOUS | Status: DC

## 2017-12-14 MED ORDER — NALOXONE HCL 4 MG/10ML IJ SOLN: 1.0000 ug/kg/h | INTRAVENOUS | Status: DC | PRN

## 2017-12-14 MED ORDER — SIMETHICONE 80 MG PO CHEW
80.0000 mg | CHEWABLE_TABLET | Freq: Three times a day (TID) | ORAL | Status: DC
  Administered 2017-12-15 – 2017-12-17 (×7): 80 mg via ORAL
  Filled 2017-12-14 (×6): qty 1

## 2017-12-14 MED ORDER — PHENYLEPHRINE 8 MG IN D5W 100 ML (0.08MG/ML) PREMIX OPTIME
INJECTION | INTRAVENOUS | Status: AC
  Filled 2017-12-14: qty 100

## 2017-12-14 MED ORDER — METHYLERGONOVINE MALEATE 0.2 MG PO TABS: 0.2000 mg | ORAL_TABLET | ORAL | Status: DC | PRN

## 2017-12-14 MED ORDER — VENLAFAXINE HCL ER 37.5 MG PO CP24
37.5000 mg | ORAL_CAPSULE | Freq: Every day | ORAL | Status: DC
  Administered 2017-12-15: 37.5 mg via ORAL
  Filled 2017-12-14: qty 1

## 2017-12-14 MED ORDER — DIPHENHYDRAMINE HCL 25 MG PO CAPS: 25.0000 mg | ORAL_CAPSULE | ORAL | Status: DC | PRN

## 2017-12-14 MED ORDER — CEFAZOLIN SODIUM-DEXTROSE 2-4 GM/100ML-% IV SOLN
INTRAVENOUS | Status: AC
  Filled 2017-12-14: qty 100

## 2017-12-14 MED ORDER — OXYTOCIN 10 UNIT/ML IJ SOLN
INTRAMUSCULAR | Status: AC
  Filled 2017-12-14: qty 1

## 2017-12-14 MED ORDER — ONDANSETRON HCL 4 MG/2ML IJ SOLN
INTRAMUSCULAR | Status: DC | PRN
  Administered 2017-12-14: 4 mg via INTRAVENOUS

## 2017-12-14 MED ORDER — TETANUS-DIPHTH-ACELL PERTUSSIS 5-2.5-18.5 LF-MCG/0.5 IM SUSP: 0.5000 mL | Freq: Once | INTRAMUSCULAR | Status: DC

## 2017-12-14 MED ORDER — DIPHENHYDRAMINE HCL 50 MG/ML IJ SOLN: 12.5000 mg | INTRAMUSCULAR | Status: DC | PRN

## 2017-12-14 MED ORDER — BUPIVACAINE HCL (PF) 0.25 % IJ SOLN
INTRAMUSCULAR | Status: AC
  Filled 2017-12-14: qty 20

## 2017-12-14 MED ORDER — LACTATED RINGERS IV SOLN
INTRAVENOUS | Status: DC
  Administered 2017-12-14: 21:00:00 via INTRAVENOUS

## 2017-12-14 MED ORDER — BUSPIRONE HCL 5 MG PO TABS
5.0000 mg | ORAL_TABLET | Freq: Two times a day (BID) | ORAL | Status: DC
  Administered 2017-12-14 – 2017-12-17 (×6): 5 mg via ORAL
  Filled 2017-12-14 (×8): qty 1

## 2017-12-14 SURGICAL SUPPLY — 37 items
APL SKNCLS STERI-STRIP NONHPOA (GAUZE/BANDAGES/DRESSINGS) ×1
BENZOIN TINCTURE PRP APPL 2/3 (GAUZE/BANDAGES/DRESSINGS) ×1 IMPLANT
CHLORAPREP W/TINT 26ML (MISCELLANEOUS) ×2 IMPLANT
CLAMP CORD UMBIL (MISCELLANEOUS) IMPLANT
CLOTH BEACON ORANGE TIMEOUT ST (SAFETY) ×2 IMPLANT
DRSG OPSITE POSTOP 4X10 (GAUZE/BANDAGES/DRESSINGS) ×2 IMPLANT
ELECT REM PT RETURN 9FT ADLT (ELECTROSURGICAL) ×2
ELECTRODE REM PT RTRN 9FT ADLT (ELECTROSURGICAL) ×1 IMPLANT
EXTRACTOR VACUUM M CUP 4 TUBE (SUCTIONS) IMPLANT
GLOVE BIO SURGEON STRL SZ7.5 (GLOVE) ×2 IMPLANT
GLOVE BIOGEL PI IND STRL 7.0 (GLOVE) ×1 IMPLANT
GLOVE BIOGEL PI INDICATOR 7.0 (GLOVE) ×1
GOWN STRL REUS W/TWL LRG LVL3 (GOWN DISPOSABLE) ×4 IMPLANT
HOVERMATT SINGLE USE (MISCELLANEOUS) ×1 IMPLANT
KIT ABG SYR 3ML LUER SLIP (SYRINGE) IMPLANT
NDL HYPO 25X5/8 SAFETYGLIDE (NEEDLE) IMPLANT
NDL SPNL 20GX3.5 QUINCKE YW (NEEDLE) IMPLANT
NEEDLE HYPO 22GX1.5 SAFETY (NEEDLE) ×2 IMPLANT
NEEDLE HYPO 25X5/8 SAFETYGLIDE (NEEDLE) IMPLANT
NEEDLE SPNL 20GX3.5 QUINCKE YW (NEEDLE) IMPLANT
NS IRRIG 1000ML POUR BTL (IV SOLUTION) ×2 IMPLANT
PACK C SECTION WH (CUSTOM PROCEDURE TRAY) ×2 IMPLANT
PENCIL SMOKE EVAC W/HOLSTER (ELECTROSURGICAL) ×2 IMPLANT
STRIP CLOSURE SKIN 1/2X4 (GAUZE/BANDAGES/DRESSINGS) ×1 IMPLANT
SUT MNCRL 0 VIOLET CTX 36 (SUTURE) ×2 IMPLANT
SUT MNCRL AB 3-0 PS2 27 (SUTURE) IMPLANT
SUT MON AB 2-0 CT1 27 (SUTURE) ×2 IMPLANT
SUT MON AB-0 CT1 36 (SUTURE) ×4 IMPLANT
SUT MONOCRYL 0 CTX 36 (SUTURE) ×2
SUT PLAIN 0 NONE (SUTURE) IMPLANT
SUT PLAIN 2 0 (SUTURE)
SUT PLAIN 2 0 XLH (SUTURE) IMPLANT
SUT PLAIN ABS 2-0 CT1 27XMFL (SUTURE) IMPLANT
SYR 20CC LL (SYRINGE) IMPLANT
SYR CONTROL 10ML LL (SYRINGE) ×2 IMPLANT
TOWEL OR 17X24 6PK STRL BLUE (TOWEL DISPOSABLE) ×2 IMPLANT
TRAY FOLEY BAG SILVER LF 14FR (SET/KITS/TRAYS/PACK) ×2 IMPLANT

## 2017-12-14 NOTE — Transfer of Care (Signed)
Immediate Anesthesia Transfer of Care Note  Patient: Sherry Arias  Procedure(s) Performed: Primary CESAREAN SECTION (N/A )  Patient Location: PACU  Anesthesia Type:Spinal  Level of Consciousness: awake, alert  and oriented  Airway & Oxygen Therapy: Patient Spontanous Breathing  Post-op Assessment: Report given to RN and Post -op Vital signs reviewed and stable  Post vital signs: Reviewed and stable  Last Vitals:  Vitals:   12/14/17 1119  BP: 139/75  Pulse: 73  Resp: 20  Temp: 37 C    Last Pain:  Vitals:   12/14/17 1119  TempSrc: Oral         Complications: No apparent anesthesia complications

## 2017-12-14 NOTE — Anesthesia Procedure Notes (Signed)
Spinal  Patient location during procedure: OR Start time: 12/14/2017 12:53 PM End time: 12/14/2017 12:56 PM Staffing Anesthesiologist: Lyn Hollingshead, MD Performed: anesthesiologist  Preanesthetic Checklist Completed: patient identified, site marked, surgical consent, pre-op evaluation, timeout performed, IV checked, risks and benefits discussed and monitors and equipment checked Spinal Block Patient position: sitting Prep: site prepped and draped and DuraPrep Patient monitoring: continuous pulse ox and blood pressure Approach: midline Location: L3-4 Injection technique: single-shot Needle Needle type: Pencan  Needle gauge: 24 G Needle length: 10 cm Needle insertion depth: 9 cm Assessment Sensory level: T4

## 2017-12-14 NOTE — Progress Notes (Signed)
Reported patients low heart rate and patients request to have dosing changed on her Effexor to CNM. No new orders.

## 2017-12-14 NOTE — Lactation Note (Signed)
This note was copied from a baby's chart. Lactation Consultation Note  Patient Name: Sherry Arias [id number] Date: 12/14/2017 Reason for consult: NICU baby;Initial assessment   P1, Baby 6 hours in NICU.  Mother recently pumped 8 ml of colostrum which was finger syringe fed to infant. Assisted mother w/ latching in cradle old  Intermittent sucks and swallows observed on the L breast. Attempted on R side but baby was sleepy.  Recommend mother post pump 8 times per day for 10-20 min with DEBP on initiation setting. Reviewed milk storage and labeling.  Mother given NICU booklet to read. Mom made aware of O/P services, breastfeeding support groups, community resources, and our phone # for post-discharge questions.  Mother will need review on hand expression.     Maternal Data Has patient been taught Hand Expression?: Yes Does the patient have breastfeeding experience prior to this delivery?: No  Feeding Feeding Type: Breast Fed Length of feed: 15 min  LATCH Score Latch: Repeated attempts needed to sustain latch, nipple held in mouth throughout feeding, stimulation needed to elicit sucking reflex.  Audible Swallowing: A few with stimulation  Type of Nipple: Everted at rest and after stimulation  Comfort (Breast/Nipple): Soft / non-tender  Hold (Positioning): Assistance needed to correctly position infant at breast and maintain latch.  LATCH Score: 7  Interventions Interventions: Assisted with latch;Skin to skin;Hand express;Adjust position;Support pillows;Position options;DEBP  Lactation Tools Discussed/Used Pump Review: Milk Storage Initiated by:: RN Date initiated:: 12/14/17   Consult Status Consult Status: Follow-up Date: 12/22/17 Follow-up type: In-patient    Vivianne Master [facility] 12/14/2017, 8:56 PM

## 2017-12-14 NOTE — Op Note (Signed)
Cesarean Section Procedure Note  Indications: Gestational HTN, Abdominal cerclage with contractions,  Pre-operative Diagnosis: 38 week 3 day pregnancy.  Post-operative Diagnosis: same  Surgeon: Lovenia Kim   Assistants: Janann Colonel, CNM  Anesthesia: Local anesthesia 0.25.% bupivacaine and Spinal anesthesia  ASA Class: 2  Procedure Details  The patient was seen in the Holding Room. The risks, benefits, complications, treatment options, and expected outcomes were discussed with the patient.  The patient concurred with the proposed plan, giving informed consent. The risks of anesthesia, infection, bleeding and possible injury to other organs discussed. Injury to bowel, bladder, or ureter with possible need for repair discussed. Possible need for transfusion with secondary risks of hepatitis or HIV acquisition discussed. Post operative complications to include but not limited to DVT, PE and Pneumonia noted. The site of surgery properly noted/marked. The patient was taken to Operating Room # 9, identified as Sherry Arias and the procedure verified as C-Section Delivery. A Time Out was held and the above information confirmed.  After induction of anesthesia, the patient was draped and prepped in the usual sterile manner. A Pfannenstiel incision was made and carried down through the subcutaneous tissue to the fascia. Fascial incision was made and extended transversely using Mayo scissors. The fascia was separated from the underlying rectus tissue superiorly and inferiorly. The peritoneum was identified and entered. Peritoneal incision was extended longitudinally. The utero-vesical peritoneal reflection was incised transversely and the bladder flap was bluntly freed from the lower uterine segment. A low transverse uterine incision(Kerr hysterotomy) was made. Light meconium stained fluid. Cerclage intact and stretched over LUS with small LUS window above cerclage.  Delivered from OT presentation  was a  female with Apgar scores of pending at one minute and pending at five minutes. Bulb suctioning gently performed. Neonatal team in attendance. To NICU due to poor transition. After the umbilical cord was clamped and cut cord blood was obtained for evaluation. The placenta was removed intact and appeared normal. The uterus was curetted with a dry lap pack. Good hemostasis was noted.The uterine outline, tubes and ovaries appeared normal. The uterine incision was closed with running locked sutures of 0 Monocryl x 2 layers. Hemostasis was observed. Lavage was carried out until clear.The parietal peritoneum was closed with a running 2-0 Monocryl suture. The fascia was then reapproximated with running sutures of 0 Monocryl. The skin was reapproximated with with 4-0 vicryl after Lely closure with 2-0 plain.  Instrument, sponge, and needle counts were correct prior the abdominal closure and at the conclusion of the case.   Findings: As noted above  Estimated Blood Loss:  550         Drains: foley                 Specimens: placenta                 Complications:  None; patient tolerated the procedure well.         Disposition: PACU - hemodynamically stable.         Condition: stable  Attending Attestation: I performed the procedure.

## 2017-12-14 NOTE — H&P (Signed)
Sherry Arias is a 37 y.o. female presenting for primary csection. History of abdominal cerclage with contractions. History of new onset Gestational HTN.  OB History    Gravida Para Term Preterm AB Living   2 1   1    0   SAB TAB Ectopic Multiple Live Births           1     Past Medical History:  Diagnosis Date  . Anxiety   . Asthma   . Depression   . Eczema   . Migraine   . PMDD (premenstrual dysphoric disorder)   . PONV (postoperative nausea and vomiting)    with spinal last cerclage 2015  . Seasonal allergies   . Supervision of normal pregnancy in second trimester 11/09/2013   Past Surgical History:  Procedure Laterality Date  . ABDOMINAL CERCLAGE    . CERVICAL CERCLAGE N/A 12/25/2013   Procedure: CERCLAGE CERVICAL;  Surgeon: Jonnie Kind, MD;  Location: [facility];  Service: Gynecology;  Laterality: N/A;  . WISDOM TOOTH EXTRACTION     Family History: family history includes Bipolar disorder in her brother and father; Cancer in her brother; Cervical cancer in her mother; Diabetes in her father and mother; Heart disease in her father and paternal grandfather; Hypertension in her father; Kidney disease in her father; Mental illness in her mother; Migraines in her mother and sister; Ovarian cancer in her mother; Stroke in her father; Varicose Veins in her mother. Social History:  reports that she quit smoking about 4 years ago. She smoked 0.50 packs per day. she has never used smokeless tobacco. She reports that she does not drink alcohol or use drugs.     Maternal Diabetes: No Genetic Screening: Normal Maternal Ultrasounds/Referrals: Normal Fetal Ultrasounds or other Referrals:  None Maternal Substance Abuse:  No Significant Maternal Medications:  None Significant Maternal Lab Results:  None Other Comments:  received BMZ  Review of Systems  Constitutional: Negative.   All other systems reviewed and are negative.  Maternal Medical History:  Contractions: Onset was less  than 1 hour ago.   Perceived severity is mild.    Fetal activity: Perceived fetal activity is normal.   Last perceived fetal movement was within the past hour.    Prenatal complications: PIH.   Prenatal Complications - Diabetes: none.      Height 5\' 6"  (1.676 m), weight 122.3 kg (269 lb 9.6 oz), unknown if currently breastfeeding. Maternal Exam:  Uterine Assessment: Contraction strength is mild.  Contraction frequency is rare.   Abdomen: Patient reports no abdominal tenderness. Fetal presentation: vertex  Introitus: Normal vulva. Normal vagina.  Ferning test: negative.  Nitrazine test: negative. Amniotic fluid character: not assessed.  Pelvis: of concern for delivery.   Cervix: Cervix evaluated by digital exam.     Physical Exam  Nursing note and vitals reviewed. Constitutional: She is oriented to person, place, and time. She appears well-developed and well-nourished.  HENT:  Head: Normocephalic and atraumatic.  Neck: Normal range of motion. Neck supple.  Cardiovascular: Normal rate and regular rhythm.  Respiratory: Effort normal and breath sounds normal.  GI: Soft. Bowel sounds are normal.  Genitourinary: Vagina normal and uterus normal.  Musculoskeletal: Normal range of motion.  Neurological: She is alert and oriented to person, place, and time. She has normal reflexes.  Skin: Skin is warm and dry.  Psychiatric: She has a normal mood and affect.    Prenatal labs: ABO, Rh: --/--/A POS (02/05 4081) Antibody: NEG (02/05 0925) Rubella:  Immune (07/17 0000) RPR: Non Reactive (02/05 0925)  HBsAg: Negative (07/17 0000)  HIV: Non-reactive (07/17 0000)  GBS:     Assessment/Plan: 38+ weeks. Cervical insufficiency with abdominal cerclage and inc contractions. Gestational HTN with nl labs Primary csection. Consent done.  Discussed with MFM.   [name] J 12/14/2017, 11:18 AM

## 2017-12-14 NOTE — Anesthesia Postprocedure Evaluation (Signed)
Anesthesia Post Note  Patient: Sherry Arias  Procedure(s) Performed: Primary CESAREAN SECTION (N/A )     Patient location during evaluation: Mother Baby Anesthesia Type: Spinal Level of consciousness: awake Pain management: pain level controlled Vital Signs Assessment: post-procedure vital signs reviewed and stable Respiratory status: spontaneous breathing Cardiovascular status: stable Postop Assessment: patient able to bend at knees and spinal receding Anesthetic complications: no    Last Vitals:  Vitals:   12/14/17 1536 12/14/17 1608  BP:  (!) 108/56  Pulse:  (!) 46  Resp:  18  Temp: 36.5 C 36.8 C  SpO2:  99%    Last Pain:  Vitals:   12/14/17 1715  TempSrc:   PainSc: 5    Pain Goal:                 Everette Rank

## 2017-12-14 NOTE — Anesthesia Preprocedure Evaluation (Signed)
Anesthesia Evaluation  Patient identified by MRN, date of birth, ID band Patient awake    Reviewed: Allergy & Precautions, H&P , NPO status , Patient's Chart, lab work & pertinent test results  History of Anesthesia Complications (+) PONV  Airway Mallampati: III  TM Distance: >3 FB Neck ROM: Full    Dental no notable dental hx. (+) Teeth Intact   Pulmonary former smoker,    Pulmonary exam normal breath sounds clear to auscultation       Cardiovascular negative cardio ROS Normal cardiovascular exam Rhythm:Regular Rate:Normal     Neuro/Psych  Headaches, PSYCHIATRIC DISORDERS Anxiety Depression    GI/Hepatic negative GI ROS, Neg liver ROS,   Endo/Other  Morbid obesity  Renal/GU negative Renal ROS  negative genitourinary   Musculoskeletal negative musculoskeletal ROS (+)   Abdominal (+) + obese,   Peds  Hematology  (+) anemia ,   Anesthesia Other Findings   Reproductive/Obstetrics PPROM 21 weeks                             Anesthesia Physical  Anesthesia Plan  ASA: III  Anesthesia Plan: Spinal   Post-op Pain Management:    Induction:   PONV Risk Score and Plan: 4 or greater and Ondansetron, Dexamethasone, Scopolamine patch - Pre-op and Treatment may vary due to age or medical condition  Airway Management Planned: Natural Airway and Nasal Cannula  Additional Equipment:   Intra-op Plan:   Post-operative Plan:   Informed Consent: I have reviewed the patients History and Physical, chart, labs and discussed the procedure including the risks, benefits and alternatives for the proposed anesthesia with the patient or authorized representative who has indicated his/her understanding and acceptance.     Plan Discussed with: CRNA and Surgeon  Anesthesia Plan Comments:         Anesthesia Quick Evaluation

## 2017-12-14 NOTE — Progress Notes (Signed)
Patient ID: Sherry Arias, female   DOB: 05/27/1981, 37 y.o.   MRN: [id number] Patient seen and examined. Consent witnessed and signed. No changes noted. Update completed. BP 139/75   Pulse 73   Temp 98.6 F (37 C) (Oral)   Resp 20   Ht 5\' 6"  (1.676 m)   Wt 122.3 kg (269 lb 9.6 oz)   BMI 43.51 kg/m  CBC    Component Value Date/Time   WBC 19.3 (H) 12/13/2017 0925   RBC 3.56 (L) 12/13/2017 0925   HGB 11.6 (L) 12/13/2017 0925   HCT 34.3 (L) 12/13/2017 0925   PLT 284 12/13/2017 0925   MCV 96.3 12/13/2017 0925   MCH 32.6 12/13/2017 0925   MCHC 33.8 12/13/2017 0925   RDW 12.9 12/13/2017 0925   LYMPHSABS 1.8 12/27/2013 0839   MONOABS 1.1 (H) 12/27/2013 0839   EOSABS 0.3 12/27/2013 0839   BASOSABS 0.0 12/27/2013 4650

## 2017-12-15 DIAGNOSIS — L259 Unspecified contact dermatitis, unspecified cause: Secondary | ICD-10-CM | POA: Diagnosis not present

## 2017-12-15 LAB — CBC
HEMATOCRIT: 29.1 % — AB (ref 36.0–46.0)
HEMOGLOBIN: 9.8 g/dL — AB (ref 12.0–15.0)
MCH: 32.8 pg (ref 26.0–34.0)
MCHC: 33.7 g/dL (ref 30.0–36.0)
MCV: 97.3 fL (ref 78.0–100.0)
Platelets: 216 10*3/uL (ref 150–400)
RBC: 2.99 MIL/uL — AB (ref 3.87–5.11)
RDW: 13.1 % (ref 11.5–15.5)
WBC: 19.4 10*3/uL — ABNORMAL HIGH (ref 4.0–10.5)

## 2017-12-15 LAB — BIRTH TISSUE RECOVERY COLLECTION (PLACENTA DONATION)

## 2017-12-15 MED ORDER — VENLAFAXINE HCL ER 37.5 MG PO CP24
37.5000 mg | ORAL_CAPSULE | Freq: Two times a day (BID) | ORAL | Status: DC
  Administered 2017-12-15 – 2017-12-17 (×4): 37.5 mg via ORAL
  Filled 2017-12-15 (×6): qty 1

## 2017-12-15 MED ORDER — HYDROCORTISONE 1 % EX CREA
TOPICAL_CREAM | Freq: Three times a day (TID) | CUTANEOUS | Status: DC
  Administered 2017-12-15: 1 via TOPICAL
  Administered 2017-12-15 – 2017-12-17 (×5): via TOPICAL
  Filled 2017-12-15 (×2): qty 28

## 2017-12-15 MED ORDER — MAGNESIUM OXIDE 400 (241.3 MG) MG PO TABS
400.0000 mg | ORAL_TABLET | Freq: Every day | ORAL | Status: DC
  Administered 2017-12-15 – 2017-12-17 (×3): 400 mg via ORAL
  Filled 2017-12-15 (×4): qty 1

## 2017-12-15 MED ORDER — HYDROCHLOROTHIAZIDE 25 MG PO TABS
25.0000 mg | ORAL_TABLET | Freq: Once | ORAL | Status: AC
  Administered 2017-12-15: 25 mg via ORAL
  Filled 2017-12-15: qty 1

## 2017-12-15 MED ORDER — PANTOPRAZOLE SODIUM 20 MG PO TBEC
20.0000 mg | DELAYED_RELEASE_TABLET | Freq: Every day | ORAL | Status: DC
  Administered 2017-12-15 – 2017-12-17 (×3): 20 mg via ORAL
  Filled 2017-12-15 (×4): qty 1

## 2017-12-15 MED ORDER — POLYSACCHARIDE IRON COMPLEX 150 MG PO CAPS
150.0000 mg | ORAL_CAPSULE | Freq: Every day | ORAL | Status: DC
  Administered 2017-12-16 – 2017-12-17 (×2): 150 mg via ORAL
  Filled 2017-12-15 (×2): qty 1

## 2017-12-15 NOTE — Progress Notes (Signed)
POSTOPERATIVE DAY # 1 S/P CS   S:         Reports feeling intense itching (told no meds available due to low pulse) / needs med corrected dose for Effexor             Tolerating po intake / no nausea / no vomiting / + flatus / no BM             Bleeding is light             Pain controlled with long-acting narcotic - motrin (causing intense itching) / reports burning at lateral sides of incision             Up ad lib / ambulatory/ voiding QS  Newborn in NICU - awaiting update this am on status/ plans breastfeeding - pumping every 4 hours   O:  VS: BP (!) 101/57 (BP Location: Left Arm)   Pulse 67   Temp 98.2 F (36.8 C) (Oral)   Resp 16   Ht 5\' 6"  (1.676 m)   Wt 122.3 kg (269 lb 9.6 oz)   SpO2 100%   Breastfeeding? Unknown   BMI 43.51 kg/m                  Pulse stable - hx pulse 55-70 in past outside of pregnancy / asymptomatic    LABS:              Recent Labs    12/13/17 0925 12/15/17 0509  WBC 19.3* 19.4*  HGB 11.6* 9.8*  PLT 284 216               Bloodtype: --/--/A POS (02/05 0925)  Rubella: Immune (07/17 0000)               tdap and flu current                                           I&O: Intake/Output      02/06 0701 - 02/07 0700 02/07 0701 - 02/08 0700   P.O. 960    I.V. (mL/kg) 2525 (20.6)    Total Intake(mL/kg) 3485 (28.5)    Urine (mL/kg/hr) 2550    Blood 616    Total Output 3166    Net +319         Urine Occurrence 1 x                 Physical Exam:             Alert and Oriented X3, scratching constantly   Lungs: Clear and unlabored  Heart: regular rate and rhythm / no mumurs  Abdomen: soft, non-tender, non-distended, hypoactive BS, rash over abdomen (shape of OR adhesive dressing)                                    some superficial excoriations from scratching             Fundus: firm, non-tender, Ueven             Dressing intact honeycomb              Incision:  approximated with suture / no erythema / no ecchymosis / no drainage  Perineum:  intact  Lochia: light  Extremities: 1+ dependent edema, no  calf pain or tenderness, SCD at bedside -encouraged to wear at rest/sleep  A:        POD # 1 S/P CS            Itching from long-acting narcotic and contact dermatitis            ABL anemia - mild            Gestational hypertension - normal BP without medication management / dependent edema / no diuresis net (+)            Anxiety and depression -increased worry and anxiety with NICU admission  P:        Routine postoperative care              Shower today to remove residue prep and adhesive from abdomen - apply hydrocortisone TID prn             K-pad for burning sensation near incision             Magnesium and iron for anemia             HCTZ single dose today - continue I&O             Adjusted Effexor dose to BID - home dose             Close PP follow-up- recommend 2 week PP interval visit     Artelia Laroche CNM, MSN, [facility] 12/15/2017, 9:14 AM

## 2017-12-16 NOTE — Progress Notes (Signed)
POSTOPERATIVE DAY # 2 S/P CS   S:         Reports feeling better today - just tried with some soreness / burning improved and itching resolved             Tolerating po intake / no nausea / no vomiting / + flatus / no BM             Bleeding is light             Up ad lib / ambulatory/ voiding QS  Newborn in NICU - stable   O:  VS: BP 131/68 (BP Location: Right Arm)   Pulse 87   Temp 98.1 F (36.7 C) (Oral)   Resp 18   Ht 5\' 6"  (1.676 m)   Wt 122.3 kg (269 lb 9.6 oz)   SpO2 99%   Breastfeeding? Unknown   BMI 43.51 kg/m    LABS:               Recent Labs    12/13/17 0925 12/15/17 0509  WBC 19.3* 19.4*  HGB 11.6* 9.8*  PLT 284 216                                  I&O: Intake/Output      02/07 0701 - 02/08 0700 02/08 0701 - 02/09 0700   P.O. 4150    I.V. (mL/kg)     Total Intake(mL/kg) 4150 (33.9)    Urine (mL/kg/hr) 7030 (2.4)    Blood     Total Output 7030    Net -2880                    Physical Exam:             Alert and Oriented X3  Lungs: Clear and unlabored  Heart: regular rate and rhythm / no mumurs  Abdomen: soft, non-tender, mildly distended, rash improved - pale pink color today             Fundus: firm, non-tender, Ueven             Dressing intact with dried drainage lateral sides              Incision:  approximated with suture / no erythema / no ecchymosis / no active drainage  Perineum: intact  Lochia: light  Extremities: 1+ pedal edema, no calf pain or tenderness, negative Homans  A:        POD # 2 S/P CS            ABL anemia            Gestational hypertension - normal BP without meds / net negtative 2880  P:        Routine postoperative care              Continue current management              DC in tomorrow   [place], MSN, [facility] 12/16/2017, 8:35 AM

## 2017-12-16 NOTE — Progress Notes (Signed)
CSW received consult due to score of 12 on the [place] Depression Screen.   CSW met with Sherry Arias in her first floor room/127 to offer support and complete assessment.  Sherry Arias was pleasant, easy to engage and receptive to CSW's visit.  She reports that she has been feeling "emotionally drained" the past two days, but feels somewhat better today after getting some sleep.  We discussed the importance of self-care throughout the postpartum time period as well as the increase in emotions after delivery as well as due to sleep deprivation.  Sherry Arias informed CSW that this is her "first full-term birth" and told CSW about her loss at 23 weeks 4 years ago.  Her first child's name was Sherry Arias and she states she had extensive counseling after this loss and is still connected with her parent matches through [employer].  She states baby, Sherry Arias, is doing much better in NICU and it is planned that he will return to her room today.  She appears to be coping well, but states she expected to be "hysterically bonded to him immediately" and states this is not how she has felt.  CSW normalized this and explained that not all mothers report feelings of intense bonding immediately.  CSW also acknowledged that it may have been more difficult for her to have bonded with baby in utero given her experience with her first child.  Sherry Arias agreed and states she did not want to acknowledge the pregnancy until "28 weeks when he was viable."  CSW explained that lack of bonding over the next few days and weeks may indicate a more significant concern and that this is something to be monitored, but not something to be overly concerned about now.  She reports that she loves him very much, but feels she has just not been able to spend as much time with him since he has been in the NICU.  CSW validated that the separation the NICU causes is unnatural, though necessary.  She is glad he will be able to return to her room.  She reports having a good support system and  everything she needs for baby.  She is aware of SIDS precautions and has a safe sleep environment for baby at home.   CSW provided education regarding Baby Blues vs PMADs and encouraged Sherry Arias to evaluate her mental health throughout the postpartum period with the use of the New Mom Checklist developed by [facility] as well as the [place] Postnatal Depression Scale.  She scored a 12 on the screen during this admission.  She states she was unsure as to what time period she should be considering when taking the assessment and CSW instructed her to think about the last 7 days when answering the questions.  CSW encouraged her to notify a medical professional if she has concerns about her mental health at any time.  She feels she has the resources to follow up if concerns arise.  She states no questions, concerns or needs at this time.  CSW identifies no barriers to discharge when Sherry Arias and baby are medically ready.

## 2017-12-16 NOTE — Lactation Note (Signed)
This note was copied from a baby's chart. Lactation Consultation Note  Patient Name: Sherry Arias [id number] Date: 12/16/2017  Mom is pumping and hand expressing every 3 hours but not obtaining colostrum.  Baby is 43 hours.  Reassured and encouraged to continue pumping and hand expressing 8-12 times in 24 hours.  Mom has a Medela pump in style at home.  Recommended attempting latching baby skin to skin in NICU.  Encouraged to call if assist needed.  Reviewed outpatient lactation services.   Maternal Data    Feeding Feeding Type: Formula Nipple Type: Slow - flow Length of feed: 10 min  LATCH Score                   Interventions    Lactation Tools Discussed/Used     Consult Status      Ave Filter 12/16/2017, 8:44 AM

## 2017-12-17 DIAGNOSIS — O9902 Anemia complicating childbirth: Secondary | ICD-10-CM | POA: Diagnosis present

## 2017-12-17 MED ORDER — POLYSACCHARIDE IRON COMPLEX 150 MG PO CAPS: 150.0000 mg | ORAL_CAPSULE | Freq: Every day | ORAL | Status: AC

## 2017-12-17 MED ORDER — OXYCODONE-ACETAMINOPHEN 5-325 MG PO TABS: 1.0000 | ORAL_TABLET | ORAL | 0 refills | Status: AC | PRN

## 2017-12-17 MED ORDER — SENNOSIDES-DOCUSATE SODIUM 8.6-50 MG PO TABS: 2.0000 | ORAL_TABLET | ORAL | Status: AC

## 2017-12-17 MED ORDER — SIMETHICONE 80 MG PO CHEW: 80.0000 mg | CHEWABLE_TABLET | Freq: Three times a day (TID) | ORAL | 0 refills | Status: AC

## 2017-12-17 MED ORDER — MAGNESIUM OXIDE 400 (241.3 MG) MG PO TABS: 400.0000 mg | ORAL_TABLET | Freq: Every day | ORAL | Status: AC

## 2017-12-17 MED ORDER — ACETAMINOPHEN 325 MG PO TABS: 650.0000 mg | ORAL_TABLET | ORAL | Status: AC | PRN

## 2017-12-17 MED ORDER — HYDROCORTISONE 1 % EX CREA: TOPICAL_CREAM | Freq: Three times a day (TID) | CUTANEOUS | 0 refills | Status: AC

## 2017-12-17 MED ORDER — COCONUT OIL OIL: 1.0000 "application " | TOPICAL_OIL | 0 refills | Status: AC | PRN

## 2017-12-17 NOTE — Progress Notes (Signed)
Subjective: POD# 3 Information for the patient's newborn:  Kasidy, Gianino [[id number]]  female   NICU stay x 2 days for TTN, back with mother since yesterday.  circ declined Baby name: Fox  Reports feeling sore, well. Feeding: breast and bottle Patient reports tolerating PO.  Breast symptoms: difficulty latching, working with LC Pain controlled with PO meds Denies HA/SOB/C/P/N/V/dizziness. Flatus present. She reports vaginal bleeding as normal, without clots.  She is ambulating, urinating without difficulty.     Objective:   VS:    Vitals:   12/15/17 1909 12/16/17 0603 12/16/17 1922 12/17/17 0531  BP: 129/69 131/68 120/69 136/88  Pulse: 89 87 (!) 101 75  Resp: 18 18 18 18   Temp: 98.3 F (36.8 C) 98.1 F (36.7 C) 98.5 F (36.9 C) 97.7 F (36.5 C)  TempSrc: Oral Oral Oral Axillary  SpO2:  99%  98%  Weight:      Height:          Intake/Output Summary (Last 24 hours) at 12/17/2017 1021 Last data filed at 12/17/2017 0531 Gross per 24 hour  Intake 3540 ml  Output 2950 ml  Net 590 ml        Recent Labs    12/15/17 0509  WBC 19.4*  HGB 9.8*  HCT 29.1*  PLT 216     Blood type: --/--/A POS (02/05 5465)  Rubella: Immune (07/17 0000)     Physical Exam:  General: alert, cooperative and no distress CV: Regular rate and rhythm Resp: clear Abdomen: soft, nontender, normal bowel sounds Incision: clean, dry and intact Uterine Fundus: firm, below umbilicus, nontender Lochia: minimal Ext: no edema, redness or tenderness in the calves or thighs      Assessment/Plan: 37 y.o.   POD# 3. [id number]                  Principal Problem:   Postpartum care following cesarean delivery (2/6) Active Problems:   Cesarean delivery delivered   Contact dermatitis   Maternal anemia, with delivery Hx anxiety / depression  - currently managed with Effexor, Wellbutrin and BuSpar  - continue current management, cl;ose F/U in office 2 weeks PP for BP and mood  assessment Gestational HTN  - BP normalized and good diuresis, no PEC s/sx  - Doing well, stable.      Routine post-op care             DC to [facility] today w/ instructions, newborn remains inpatient for feeding difficulties  F/U at [place] in 6 weeks and PRN   Juliene Pina, CNM, MSN 12/17/2017, 10:21 AM

## 2017-12-17 NOTE — Lactation Note (Signed)
This note was copied from a baby's chart. Lactation Consultation Note  Patient Name: Boy Keiondra Brookover [id number] Date: 12/17/2017 Reason for consult: Follow-up assessment;Other (Comment)(transitioning back to breast )  Baby is 77 hours old was initially in NICU and transferred back to [facility].  As LC entered the room, baby asleep and moms best friend holding baby.  Per mom has pumped with the DEBP 5-6 times in the last 24 hours, and worked on re-latching.  Mom mentioned the baby latches but does feed very long due to slow flow.  LC reassured mom it will improve once the milk comes in.  [facility] reviewed supply and demand / importance of consistent stimulation until the baby can be the  Consistent stimulation. STS feedings until the baby can stay awake for a feeding.  Take the baby to the breast calmly , may need to do give appetizer from a bottle and then latch.  Per mom feels comfortable with hand expressing.  Sore nipple and engorgement prevention reviewed. Per mom has  DEBP Medela at home.  Also is taking the DEBP kit with her.  Mom receptive to coming back for [facility] appt. At [facility] and will plan on calling as milk is coming in.  Mother informed of post-discharge support and given phone number to the lactation department, including services for phone call assistance; out-patient appointments; and breastfeeding support group. List of other breastfeeding resources in the community given in the handout. Encouraged mother to call for problems or concerns related to breastfeeding.   Maternal Data Has patient been taught Hand Expression?: Yes(per mom feels comfortable with Technique )  Feeding Feeding Type: (baby recently fed and is sleeping ) Nipple Type: Slow - flow  LATCH Score                   Interventions Interventions: Breast feeding basics reviewed  Lactation Tools Discussed/Used WIC Program: No Pump Review: Milk Storage   Consult Status Consult Status: Follow-up Date:  (momplans to call the [facility] clinic for [facility] appt. ) Follow-up type: [place] 12/17/2017, 12:06 PM

## 2017-12-17 NOTE — Discharge Summary (Signed)
OB Discharge Summary     Patient Name: Sherry Arias DOB: 08/20/1981 MRN: [id number]  Date of admission: 12/14/2017 Delivering MD: Brien Few   Date of discharge: 12/17/2017  Admitting diagnosis: Abdominal Cerclage, Preterm Contractions, Malpresentation Intrauterine pregnancy: [redacted]w[redacted]d     Secondary diagnosis:  Principal Problem:   Postpartum care following cesarean delivery (2/6) Active Problems:   Cesarean delivery delivered   Contact dermatitis   Maternal anemia, with delivery   Gestational hypertension Anxiety/depression      Discharge diagnosis: Term Pregnancy Delivered, Anemia and anxiety                                                                                                Post partum procedures:none  Complications: None  Hospital course:  Sceduled C/S   37 y.o. yo G2P1101 at [redacted]w[redacted]d was admitted to the hospital 12/14/2017 for scheduled cesarean section with the following indication: abdominal cerclage.  Membrane Rupture Time/Date: 1:13 PM ,12/14/2017   Patient delivered a Viable infant.12/14/2017  Details of operation can be found in separate operative note.  Pateint had an uncomplicated postpartum course.  She is ambulating, tolerating a regular diet, passing flatus, and urinating well. Patient is discharged home in stable condition on  12/17/17         Physical exam  Vitals:   12/15/17 1909 12/16/17 0603 12/16/17 1922 12/17/17 0531  BP: 129/69 131/68 120/69 136/88  Pulse: 89 87 (!) 101 75  Resp: 18 18 18 18   Temp: 98.3 F (36.8 C) 98.1 F (36.7 C) 98.5 F (36.9 C) 97.7 F (36.5 C)  TempSrc: Oral Oral Oral Axillary  SpO2:  99%  98%  Weight:      Height:       General: alert, cooperative and no distress Lochia: appropriate Uterine Fundus: firm Incision: Healing well with no significant drainage DVT Evaluation: No cords or calf tenderness. No significant calf/ankle edema. Labs: Lab Results  Component Value Date   WBC 19.4 (H) 12/15/2017   HGB  9.8 (L) 12/15/2017   HCT 29.1 (L) 12/15/2017   MCV 97.3 12/15/2017   PLT 216 12/15/2017   CMP Latest Ref Rng & Units 12/26/2013  Glucose 70 - 99 mg/dL 86  BUN 6 - 23 mg/dL 5(L)  Creatinine 0.50 - 1.10 mg/dL 0.48(L)  Sodium 137 - 147 mEq/L 138  Potassium 3.7 - 5.3 mEq/L 3.8  Chloride 96 - 112 mEq/L 105  CO2 19 - 32 mEq/L 22  Calcium 8.4 - 10.5 mg/dL 8.3(L)  Total Protein 6.0 - 8.3 g/dL -  Total Bilirubin 0.3 - 1.2 mg/dL -  Alkaline Phos 39 - 117 U/L -  AST 0 - 37 U/L -  ALT 0 - 35 U/L -    Discharge instruction: per After Visit Summary and "Baby and Me Booklet".  After visit meds:  Allergies as of 12/17/2017      Reactions   Aspirin Shortness Of Breath   Ibuprofen Swelling   Skin peeling - no more than 600 mg for no more than 2 or 3 days      Medication List  TAKE these medications   acetaminophen 325 MG tablet Commonly known as:  TYLENOL Take 2 tablets (650 mg total) by mouth every 4 (four) hours as needed (for pain scale < 4). What changed:    medication strength  how much to take  when to take this  reasons to take this   buPROPion 300 MG 24 hr tablet Commonly known as:  WELLBUTRIN XL Take 300 mg by mouth daily.   busPIRone 5 MG tablet Commonly known as:  BUSPAR Take 5 mg by mouth 2 (two) times daily.   butalbital-acetaminophen-caffeine 50-325-40 MG tablet Commonly known as:  FIORICET, ESGIC Take 1 tablet by mouth 2 (two) times daily as needed for headache.   CITRANATAL HARMONY 27-1-260 MG Caps Take 1 tablet by mouth daily.   clonazePAM 1 MG tablet Commonly known as:  KLONOPIN Take 1 mg by mouth 2 (two) times daily as needed for anxiety.   coconut oil Oil Apply 1 application topically as needed.   diphenhydrAMINE 25 MG tablet Commonly known as:  BENADRYL Take 25 mg by mouth every 6 (six) hours as needed for allergies or sleep.   EPINEPHrine 0.3 mg/0.3 mL Soaj injection Commonly known as:  EPI-PEN Inject 0.3 mg into the muscle as needed.    esomeprazole 20 MG capsule Commonly known as:  NEXIUM Take 20 mg by mouth daily at 12 noon.   Folic Acid-Vit B4-WHQ P59 2.5-25-1 MG Tabs tablet Commonly known as:  FOLBEE Take 1 tablet by mouth daily.   hydrocortisone cream 1 % Apply topically 3 (three) times daily.   iron polysaccharides 150 MG capsule Commonly known as:  NIFEREX Take 1 capsule (150 mg total) by mouth daily. Start taking on:  12/18/2017   magnesium oxide 400 (241.3 Mg) MG tablet Commonly known as:  MAG-OX Take 1 tablet (400 mg total) by mouth daily. Start taking on:  12/18/2017   oxyCODONE-acetaminophen 5-325 MG tablet Commonly known as:  PERCOCET/ROXICET Take 1 tablet by mouth every 4 (four) hours as needed (pain scale 4-7).   senna-docusate 8.6-50 MG tablet Commonly known as:  Senokot-S Take 2 tablets by mouth daily. Start taking on:  12/18/2017   simethicone 80 MG chewable tablet Commonly known as:  MYLICON Chew 1 tablet (80 mg total) by mouth 3 (three) times daily after meals.   venlafaxine XR 37.5 MG 24 hr capsule Commonly known as:  EFFEXOR-XR Take 37.5 mg by mouth 2 (two) times daily.   Vitamin D-3 5000 units Tabs Take 1 tablet by mouth daily.       Diet: routine diet  Activity: Advance as tolerated. Pelvic rest for 6 weeks.   Outpatient follow up:2 weeks  Postpartum contraception: Not Discussed  Newborn Data: Live born female  Birth Weight: 7 lb 0.2 oz (3180 g) APGAR: 4, 6  Newborn Delivery   Birth date/time:  12/14/2017 13:13:00 Delivery type:  C-Section, Low Transverse C-section categorization:  Primary     Baby Feeding: Bottle and Breast Disposition:rooming in   12/17/2017 Juliene Pina, CNM

## 2017-12-21 DIAGNOSIS — R030 Elevated blood-pressure reading, without diagnosis of hypertension: Secondary | ICD-10-CM | POA: Diagnosis not present

## 2017-12-21 DIAGNOSIS — O135 Gestational [pregnancy-induced] hypertension without significant proteinuria, complicating the puerperium: Secondary | ICD-10-CM | POA: Diagnosis not present

## 2017-12-26 DIAGNOSIS — O135 Gestational [pregnancy-induced] hypertension without significant proteinuria, complicating the puerperium: Secondary | ICD-10-CM | POA: Diagnosis not present

## 2017-12-26 DIAGNOSIS — R030 Elevated blood-pressure reading, without diagnosis of hypertension: Secondary | ICD-10-CM | POA: Diagnosis not present

## 2018-01-16 DIAGNOSIS — O900 Disruption of cesarean delivery wound: Secondary | ICD-10-CM | POA: Diagnosis not present

## 2018-01-25 DIAGNOSIS — Z1151 Encounter for screening for human papillomavirus (HPV): Secondary | ICD-10-CM | POA: Diagnosis not present

## 2018-01-25 DIAGNOSIS — Z124 Encounter for screening for malignant neoplasm of cervix: Secondary | ICD-10-CM | POA: Diagnosis not present

## 2018-02-02 DIAGNOSIS — R1084 Generalized abdominal pain: Secondary | ICD-10-CM | POA: Diagnosis not present

## 2018-02-02 DIAGNOSIS — R339 Retention of urine, unspecified: Secondary | ICD-10-CM | POA: Diagnosis not present

## 2018-02-02 DIAGNOSIS — N200 Calculus of kidney: Secondary | ICD-10-CM | POA: Diagnosis not present

## 2018-02-03 DIAGNOSIS — R3914 Feeling of incomplete bladder emptying: Secondary | ICD-10-CM | POA: Diagnosis not present

## 2018-02-03 DIAGNOSIS — Z87442 Personal history of urinary calculi: Secondary | ICD-10-CM | POA: Diagnosis not present

## 2018-02-03 DIAGNOSIS — N200 Calculus of kidney: Secondary | ICD-10-CM | POA: Diagnosis not present

## 2018-02-03 DIAGNOSIS — R3916 Straining to void: Secondary | ICD-10-CM | POA: Diagnosis not present

## 2018-02-03 DIAGNOSIS — R1032 Left lower quadrant pain: Secondary | ICD-10-CM | POA: Diagnosis not present

## 2018-03-02 DIAGNOSIS — Z6841 Body Mass Index (BMI) 40.0 and over, adult: Secondary | ICD-10-CM | POA: Diagnosis not present

## 2018-03-02 DIAGNOSIS — J45901 Unspecified asthma with (acute) exacerbation: Secondary | ICD-10-CM | POA: Diagnosis not present

## 2018-03-02 DIAGNOSIS — R0981 Nasal congestion: Secondary | ICD-10-CM | POA: Diagnosis not present

## 2018-04-10 DIAGNOSIS — F332 Major depressive disorder, recurrent severe without psychotic features: Secondary | ICD-10-CM | POA: Diagnosis not present

## 2018-04-10 DIAGNOSIS — R51 Headache: Secondary | ICD-10-CM | POA: Diagnosis not present

## 2018-04-21 DIAGNOSIS — M9902 Segmental and somatic dysfunction of thoracic region: Secondary | ICD-10-CM | POA: Diagnosis not present

## 2018-04-21 DIAGNOSIS — M546 Pain in thoracic spine: Secondary | ICD-10-CM | POA: Diagnosis not present

## 2018-04-21 DIAGNOSIS — M9901 Segmental and somatic dysfunction of cervical region: Secondary | ICD-10-CM | POA: Diagnosis not present

## 2018-04-21 DIAGNOSIS — M542 Cervicalgia: Secondary | ICD-10-CM | POA: Diagnosis not present

## 2018-04-26 DIAGNOSIS — M546 Pain in thoracic spine: Secondary | ICD-10-CM | POA: Diagnosis not present

## 2018-04-26 DIAGNOSIS — M9901 Segmental and somatic dysfunction of cervical region: Secondary | ICD-10-CM | POA: Diagnosis not present

## 2018-04-26 DIAGNOSIS — M9902 Segmental and somatic dysfunction of thoracic region: Secondary | ICD-10-CM | POA: Diagnosis not present

## 2018-04-26 DIAGNOSIS — M542 Cervicalgia: Secondary | ICD-10-CM | POA: Diagnosis not present

## 2018-05-10 DIAGNOSIS — M542 Cervicalgia: Secondary | ICD-10-CM | POA: Diagnosis not present

## 2018-05-10 DIAGNOSIS — M9902 Segmental and somatic dysfunction of thoracic region: Secondary | ICD-10-CM | POA: Diagnosis not present

## 2018-05-10 DIAGNOSIS — M9901 Segmental and somatic dysfunction of cervical region: Secondary | ICD-10-CM | POA: Diagnosis not present

## 2018-05-10 DIAGNOSIS — M546 Pain in thoracic spine: Secondary | ICD-10-CM | POA: Diagnosis not present

## 2018-05-15 DIAGNOSIS — F332 Major depressive disorder, recurrent severe without psychotic features: Secondary | ICD-10-CM | POA: Diagnosis not present

## 2018-05-15 DIAGNOSIS — M542 Cervicalgia: Secondary | ICD-10-CM | POA: Diagnosis not present

## 2018-05-15 DIAGNOSIS — M9901 Segmental and somatic dysfunction of cervical region: Secondary | ICD-10-CM | POA: Diagnosis not present

## 2018-05-15 DIAGNOSIS — G43011 Migraine without aura, intractable, with status migrainosus: Secondary | ICD-10-CM | POA: Diagnosis not present

## 2018-05-15 DIAGNOSIS — Z6841 Body Mass Index (BMI) 40.0 and over, adult: Secondary | ICD-10-CM | POA: Diagnosis not present

## 2018-05-19 DIAGNOSIS — R0981 Nasal congestion: Secondary | ICD-10-CM | POA: Diagnosis not present

## 2018-05-19 DIAGNOSIS — F332 Major depressive disorder, recurrent severe without psychotic features: Secondary | ICD-10-CM | POA: Diagnosis not present

## 2018-05-19 DIAGNOSIS — Z6841 Body Mass Index (BMI) 40.0 and over, adult: Secondary | ICD-10-CM | POA: Diagnosis not present

## 2018-05-19 DIAGNOSIS — J45901 Unspecified asthma with (acute) exacerbation: Secondary | ICD-10-CM | POA: Diagnosis not present

## 2018-05-19 DIAGNOSIS — R51 Headache: Secondary | ICD-10-CM | POA: Diagnosis not present

## 2018-05-23 DIAGNOSIS — M542 Cervicalgia: Secondary | ICD-10-CM | POA: Diagnosis not present

## 2018-05-23 DIAGNOSIS — M9901 Segmental and somatic dysfunction of cervical region: Secondary | ICD-10-CM | POA: Diagnosis not present

## 2018-05-23 DIAGNOSIS — M546 Pain in thoracic spine: Secondary | ICD-10-CM | POA: Diagnosis not present

## 2018-05-23 DIAGNOSIS — M9902 Segmental and somatic dysfunction of thoracic region: Secondary | ICD-10-CM | POA: Diagnosis not present

## 2018-05-29 DIAGNOSIS — J45998 Other asthma: Secondary | ICD-10-CM | POA: Diagnosis not present

## 2018-05-29 DIAGNOSIS — R51 Headache: Secondary | ICD-10-CM | POA: Diagnosis not present

## 2018-05-29 DIAGNOSIS — Z0001 Encounter for general adult medical examination with abnormal findings: Secondary | ICD-10-CM | POA: Diagnosis not present

## 2018-06-12 DIAGNOSIS — F332 Major depressive disorder, recurrent severe without psychotic features: Secondary | ICD-10-CM | POA: Diagnosis not present

## 2018-06-12 DIAGNOSIS — J45990 Exercise induced bronchospasm: Secondary | ICD-10-CM | POA: Diagnosis not present

## 2018-06-12 DIAGNOSIS — G43019 Migraine without aura, intractable, without status migrainosus: Secondary | ICD-10-CM | POA: Diagnosis not present

## 2018-06-20 DIAGNOSIS — Z800 Family history of malignant neoplasm of digestive organs: Secondary | ICD-10-CM | POA: Diagnosis not present

## 2018-06-20 DIAGNOSIS — Z1211 Encounter for screening for malignant neoplasm of colon: Secondary | ICD-10-CM | POA: Diagnosis not present

## 2018-06-20 DIAGNOSIS — K5900 Constipation, unspecified: Secondary | ICD-10-CM | POA: Diagnosis not present

## 2018-06-20 DIAGNOSIS — K219 Gastro-esophageal reflux disease without esophagitis: Secondary | ICD-10-CM | POA: Diagnosis not present

## 2018-07-04 DIAGNOSIS — M9901 Segmental and somatic dysfunction of cervical region: Secondary | ICD-10-CM | POA: Diagnosis not present

## 2018-07-04 DIAGNOSIS — G43009 Migraine without aura, not intractable, without status migrainosus: Secondary | ICD-10-CM | POA: Diagnosis not present

## 2018-07-04 DIAGNOSIS — M546 Pain in thoracic spine: Secondary | ICD-10-CM | POA: Diagnosis not present

## 2018-07-04 DIAGNOSIS — M9902 Segmental and somatic dysfunction of thoracic region: Secondary | ICD-10-CM | POA: Diagnosis not present

## 2018-07-17 DIAGNOSIS — Z1211 Encounter for screening for malignant neoplasm of colon: Secondary | ICD-10-CM | POA: Diagnosis not present

## 2018-07-17 DIAGNOSIS — Z800 Family history of malignant neoplasm of digestive organs: Secondary | ICD-10-CM | POA: Diagnosis not present

## 2018-08-02 DIAGNOSIS — M545 Low back pain: Secondary | ICD-10-CM | POA: Diagnosis not present

## 2018-08-02 DIAGNOSIS — M9903 Segmental and somatic dysfunction of lumbar region: Secondary | ICD-10-CM | POA: Diagnosis not present

## 2018-08-02 DIAGNOSIS — M542 Cervicalgia: Secondary | ICD-10-CM | POA: Diagnosis not present

## 2018-08-02 DIAGNOSIS — M9901 Segmental and somatic dysfunction of cervical region: Secondary | ICD-10-CM | POA: Diagnosis not present

## 2018-08-21 DIAGNOSIS — G43011 Migraine without aura, intractable, with status migrainosus: Secondary | ICD-10-CM | POA: Diagnosis not present

## 2018-08-21 DIAGNOSIS — G4700 Insomnia, unspecified: Secondary | ICD-10-CM | POA: Diagnosis not present

## 2018-08-21 DIAGNOSIS — F332 Major depressive disorder, recurrent severe without psychotic features: Secondary | ICD-10-CM | POA: Diagnosis not present

## 2018-09-19 DIAGNOSIS — N390 Urinary tract infection, site not specified: Secondary | ICD-10-CM | POA: Diagnosis not present

## 2018-09-19 DIAGNOSIS — R51 Headache: Secondary | ICD-10-CM | POA: Diagnosis not present

## 2018-09-19 DIAGNOSIS — R0981 Nasal congestion: Secondary | ICD-10-CM | POA: Diagnosis not present

## 2018-09-19 DIAGNOSIS — Z6841 Body Mass Index (BMI) 40.0 and over, adult: Secondary | ICD-10-CM | POA: Diagnosis not present

## 2018-09-19 DIAGNOSIS — J45901 Unspecified asthma with (acute) exacerbation: Secondary | ICD-10-CM | POA: Diagnosis not present

## 2018-10-11 DIAGNOSIS — M9903 Segmental and somatic dysfunction of lumbar region: Secondary | ICD-10-CM | POA: Diagnosis not present

## 2018-10-11 DIAGNOSIS — M545 Low back pain: Secondary | ICD-10-CM | POA: Diagnosis not present

## 2018-10-11 DIAGNOSIS — G43009 Migraine without aura, not intractable, without status migrainosus: Secondary | ICD-10-CM | POA: Diagnosis not present

## 2018-10-11 DIAGNOSIS — M9901 Segmental and somatic dysfunction of cervical region: Secondary | ICD-10-CM | POA: Diagnosis not present

## 2018-10-30 DIAGNOSIS — M546 Pain in thoracic spine: Secondary | ICD-10-CM | POA: Diagnosis not present

## 2018-10-30 DIAGNOSIS — M9902 Segmental and somatic dysfunction of thoracic region: Secondary | ICD-10-CM | POA: Diagnosis not present

## 2018-10-30 DIAGNOSIS — M542 Cervicalgia: Secondary | ICD-10-CM | POA: Diagnosis not present

## 2018-10-30 DIAGNOSIS — M9901 Segmental and somatic dysfunction of cervical region: Secondary | ICD-10-CM | POA: Diagnosis not present

## 2018-11-06 DIAGNOSIS — G43011 Migraine without aura, intractable, with status migrainosus: Secondary | ICD-10-CM | POA: Diagnosis not present

## 2018-11-06 DIAGNOSIS — F332 Major depressive disorder, recurrent severe without psychotic features: Secondary | ICD-10-CM | POA: Diagnosis not present

## 2018-11-06 DIAGNOSIS — G4700 Insomnia, unspecified: Secondary | ICD-10-CM | POA: Diagnosis not present

## 2019-01-08 DIAGNOSIS — F332 Major depressive disorder, recurrent severe without psychotic features: Secondary | ICD-10-CM | POA: Diagnosis not present

## 2019-01-08 DIAGNOSIS — J45901 Unspecified asthma with (acute) exacerbation: Secondary | ICD-10-CM | POA: Diagnosis not present

## 2019-01-08 DIAGNOSIS — G43019 Migraine without aura, intractable, without status migrainosus: Secondary | ICD-10-CM | POA: Diagnosis not present

## 2019-01-08 DIAGNOSIS — Z6841 Body Mass Index (BMI) 40.0 and over, adult: Secondary | ICD-10-CM | POA: Diagnosis not present

## 2019-01-08 DIAGNOSIS — R51 Headache: Secondary | ICD-10-CM | POA: Diagnosis not present

## 2019-01-08 DIAGNOSIS — R0981 Nasal congestion: Secondary | ICD-10-CM | POA: Diagnosis not present

## 2019-01-08 DIAGNOSIS — G4700 Insomnia, unspecified: Secondary | ICD-10-CM | POA: Diagnosis not present

## 2019-03-19 DIAGNOSIS — N3001 Acute cystitis with hematuria: Secondary | ICD-10-CM | POA: Diagnosis not present

## 2019-03-21 DIAGNOSIS — Z01419 Encounter for gynecological examination (general) (routine) without abnormal findings: Secondary | ICD-10-CM | POA: Diagnosis not present

## 2019-03-21 DIAGNOSIS — Z6841 Body Mass Index (BMI) 40.0 and over, adult: Secondary | ICD-10-CM | POA: Diagnosis not present

## 2019-04-19 DIAGNOSIS — E6609 Other obesity due to excess calories: Secondary | ICD-10-CM | POA: Diagnosis not present

## 2019-04-19 DIAGNOSIS — Z6841 Body Mass Index (BMI) 40.0 and over, adult: Secondary | ICD-10-CM | POA: Diagnosis not present

## 2019-05-01 DIAGNOSIS — R310 Gross hematuria: Secondary | ICD-10-CM | POA: Diagnosis not present

## 2019-05-14 DIAGNOSIS — R310 Gross hematuria: Secondary | ICD-10-CM | POA: Diagnosis not present

## 2019-06-04 DIAGNOSIS — G4700 Insomnia, unspecified: Secondary | ICD-10-CM | POA: Diagnosis not present

## 2019-06-04 DIAGNOSIS — F332 Major depressive disorder, recurrent severe without psychotic features: Secondary | ICD-10-CM | POA: Diagnosis not present

## 2019-06-04 DIAGNOSIS — F419 Anxiety disorder, unspecified: Secondary | ICD-10-CM | POA: Diagnosis not present

## 2019-07-10 DIAGNOSIS — Z0000 Encounter for general adult medical examination without abnormal findings: Secondary | ICD-10-CM | POA: Diagnosis not present

## 2019-07-23 DIAGNOSIS — R51 Headache: Secondary | ICD-10-CM | POA: Diagnosis not present

## 2019-07-23 DIAGNOSIS — J45901 Unspecified asthma with (acute) exacerbation: Secondary | ICD-10-CM | POA: Diagnosis not present

## 2019-07-23 DIAGNOSIS — J45998 Other asthma: Secondary | ICD-10-CM | POA: Diagnosis not present

## 2019-07-23 DIAGNOSIS — G43019 Migraine without aura, intractable, without status migrainosus: Secondary | ICD-10-CM | POA: Diagnosis not present

## 2019-07-23 DIAGNOSIS — Z0001 Encounter for general adult medical examination with abnormal findings: Secondary | ICD-10-CM | POA: Diagnosis not present

## 2019-07-23 DIAGNOSIS — Z6841 Body Mass Index (BMI) 40.0 and over, adult: Secondary | ICD-10-CM | POA: Diagnosis not present

## 2019-07-23 DIAGNOSIS — R0981 Nasal congestion: Secondary | ICD-10-CM | POA: Diagnosis not present

## 2019-07-23 DIAGNOSIS — M791 Myalgia, unspecified site: Secondary | ICD-10-CM | POA: Diagnosis not present

## 2021-06-22 ENCOUNTER — Other Ambulatory Visit: Payer: Self-pay | Admitting: Obstetrics and Gynecology

## 2021-06-22 DIAGNOSIS — R928 Other abnormal and inconclusive findings on diagnostic imaging of breast: Secondary | ICD-10-CM

## 2021-07-20 ENCOUNTER — Ambulatory Visit: Payer: Self-pay

## 2021-07-20 ENCOUNTER — Other Ambulatory Visit: Payer: Self-pay

## 2021-07-20 ENCOUNTER — Ambulatory Visit
Admission: RE | Admit: 2021-07-20 | Discharge: 2021-07-20 | Disposition: A | Discharge status: Home / Self Care | Payer: 59 | Source: Ambulatory Visit | Attending: Obstetrics and Gynecology | Admitting: Obstetrics and Gynecology

## 2021-07-20 DIAGNOSIS — R928 Other abnormal and inconclusive findings on diagnostic imaging of breast: Secondary | ICD-10-CM

## 2022-01-03 IMAGING — MG MM DIGITAL DIAGNOSTIC UNILAT*L* W/ TOMO W/ CAD
8 series · 9 of 24 positions shown · non-contrast
Comparison: Previous exam(s).

CLINICAL DATA: Screening recall for a left breast asymmetry.

EXAM:
DIGITAL DIAGNOSTIC UNILATERAL LEFT MAMMOGRAM WITH TOMOSYNTHESIS AND
CAD
TECHNIQUE: Left digital diagnostic mammography and breast tomosynthesis was
performed. The images were evaluated with computer-aided detection.

[L CC synth-2D]
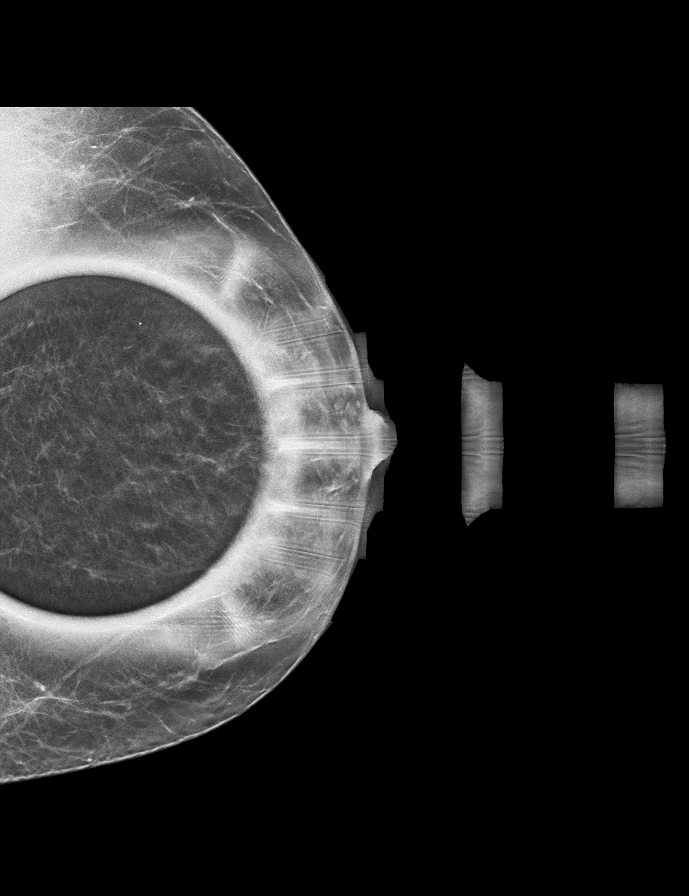

[L MLO synth-2D (1 of 3)]
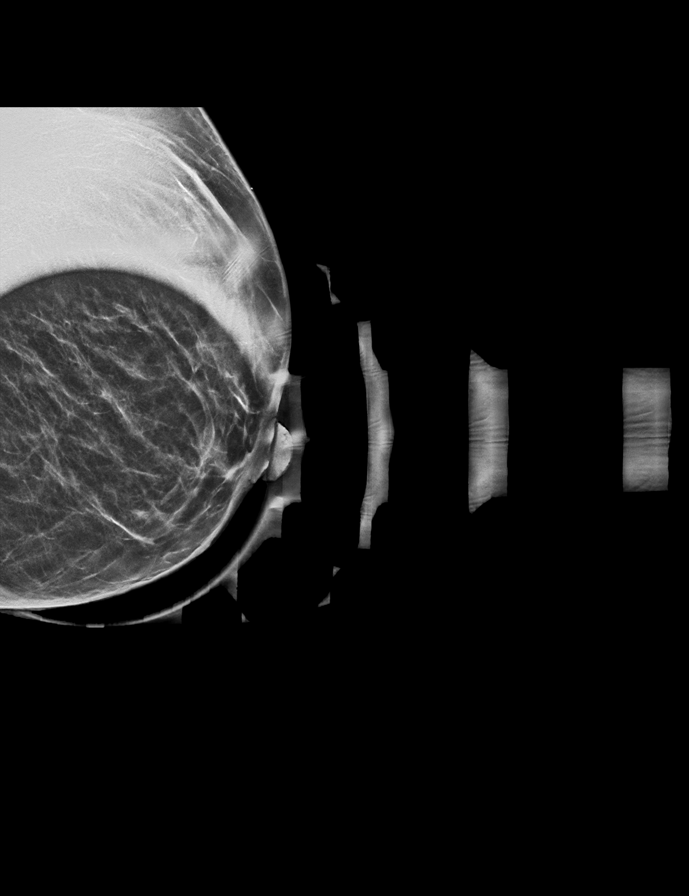

[L MLO synth-2D (2 of 3)]
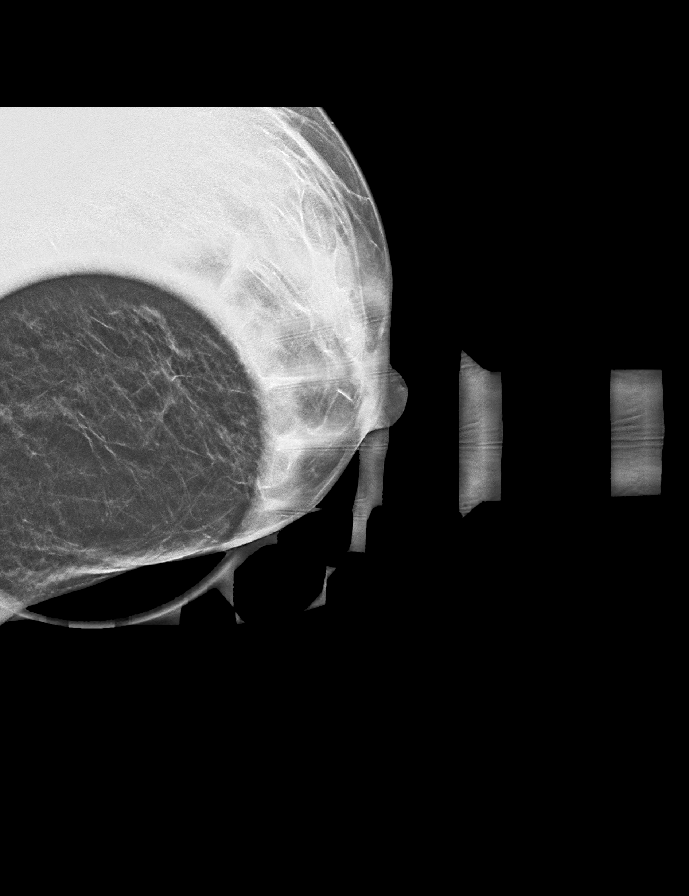

[L MLO synth-2D (3 of 3)]
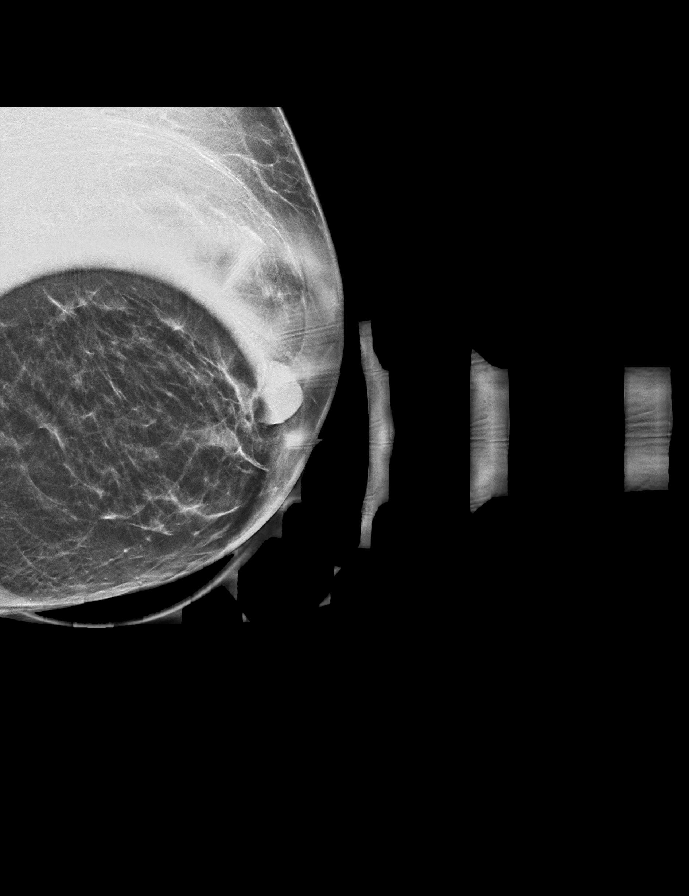

[L MLO tomo · 2 of 46 frames shown (1 of 3)]
[frame 15/46]
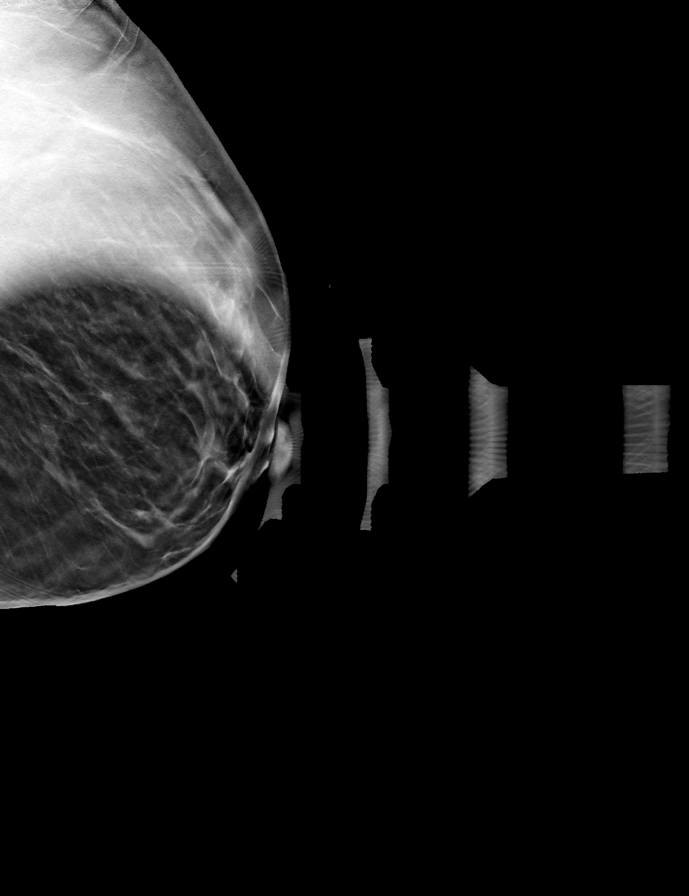
[frame 23/46]
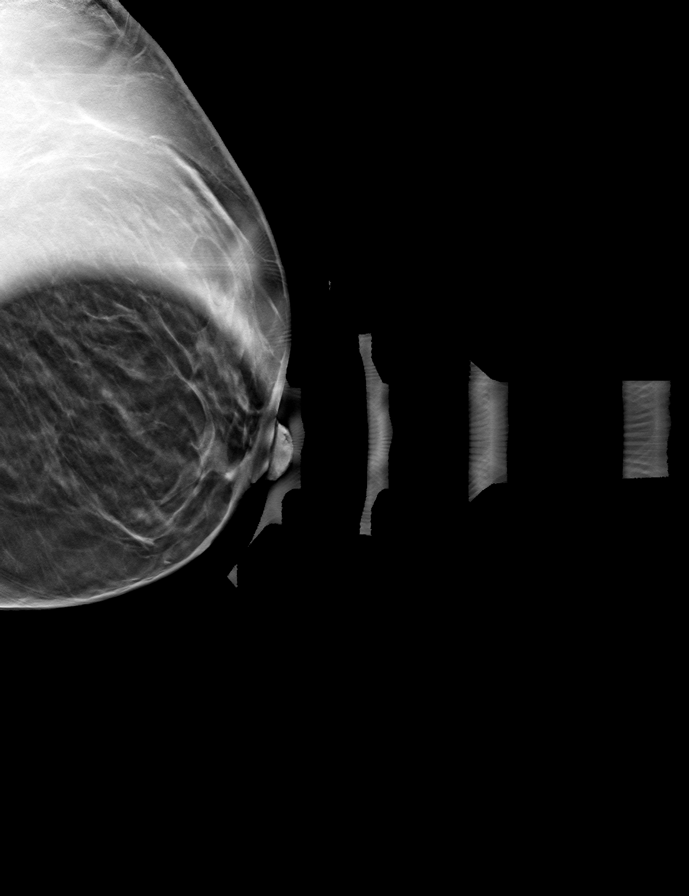

[L MLO tomo (2 of 3) · tomo slice 20/39.0]
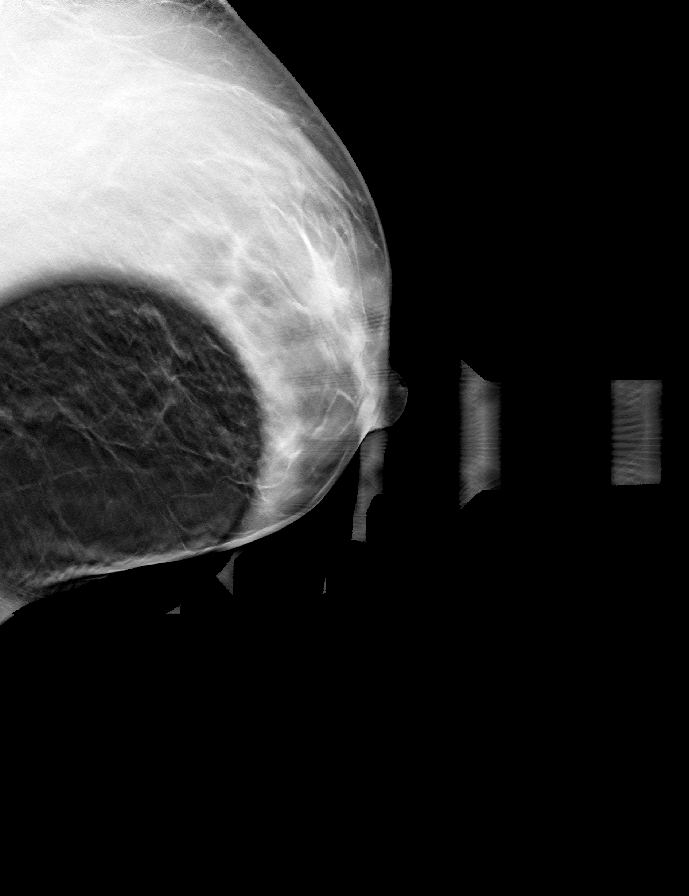

[L CC tomo · tomo slice 21/41.0]
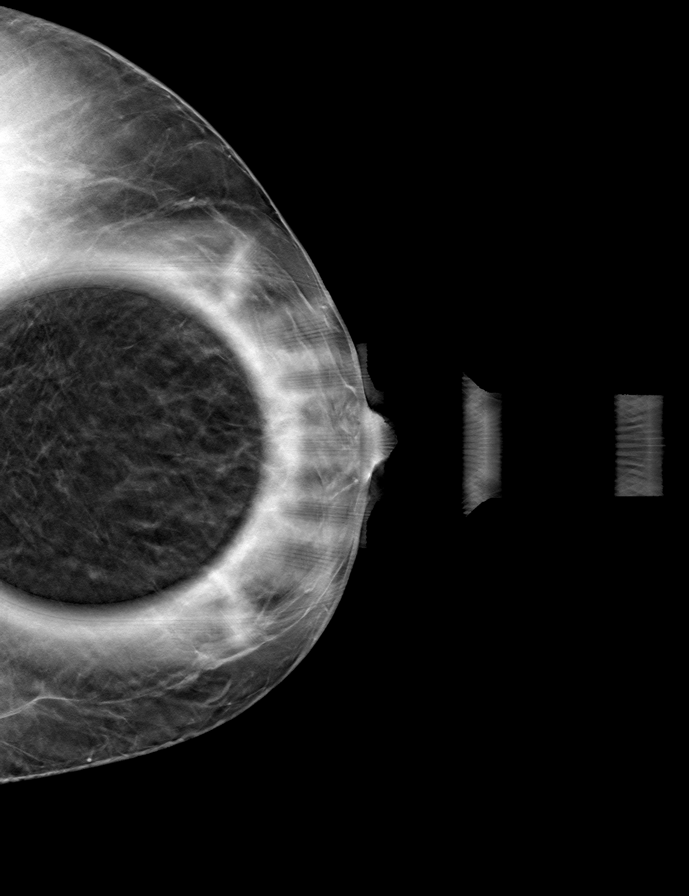

[L MLO tomo (3 of 3) · tomo slice 24/47.0]
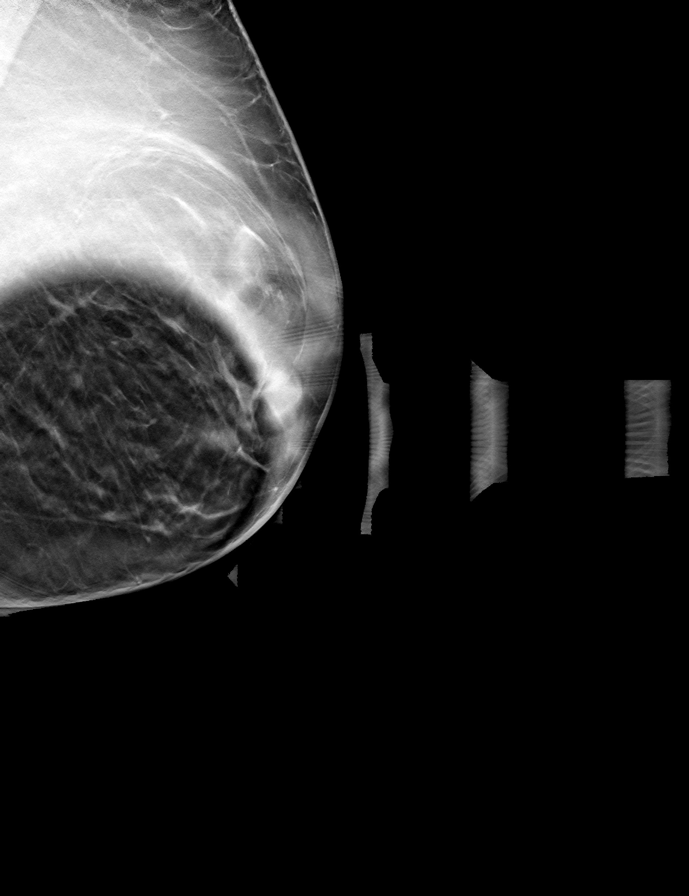

[9 of 24 positions shown; findings below may reference images not displayed]

ACR Breast Density Category b: There are scattered areas of
fibroglandular density.
FINDINGS: The asymmetry of concern in the lower-inner left breast resolves
with spot compression tomosynthesis imaging. No suspicious
calcifications, masses or areas of distortion are seen in the left
breast.
IMPRESSION: Resolution of the left breast asymmetry consistent with overlapping
fibroglandular tissue.

RECOMMENDATION:
Screening mammogram in one year.(Code:[id number]-R-853)

I have discussed the findings and recommendations with the patient.
If applicable, a reminder letter will be sent to the patient
regarding the next appointment.

BI-RADS CATEGORY  1: Negative.
